# Patient Record
Sex: Female | Born: 1970 | Race: Black or African American | Hispanic: No | Marital: Single | Smoking: Never smoker
Health system: Southern US, Community
[De-identification: ages and names within clinical notes are randomized; demographics above are authoritative.]

## PROBLEM LIST (undated history)

## (undated) DIAGNOSIS — R609 Edema, unspecified: Secondary | ICD-10-CM

## (undated) DIAGNOSIS — E079 Disorder of thyroid, unspecified: Secondary | ICD-10-CM

## (undated) DIAGNOSIS — E039 Hypothyroidism, unspecified: Secondary | ICD-10-CM

## (undated) DIAGNOSIS — T7840XA Allergy, unspecified, initial encounter: Secondary | ICD-10-CM

## (undated) DIAGNOSIS — R011 Cardiac murmur, unspecified: Secondary | ICD-10-CM

## (undated) DIAGNOSIS — D649 Anemia, unspecified: Secondary | ICD-10-CM

## (undated) DIAGNOSIS — J45909 Unspecified asthma, uncomplicated: Secondary | ICD-10-CM

## (undated) HISTORY — PX: BREAST SURGERY: SHX581

## (undated) HISTORY — PX: CHOLECYSTECTOMY: SHX55

## (undated) HISTORY — PX: BREAST CYST ASPIRATION: SHX578

## (undated) HISTORY — PX: TUBAL LIGATION: SHX77

---

## 1998-12-23 ENCOUNTER — Other Ambulatory Visit: Admission: RE | Admit: 1998-12-23 | Discharge: 1998-12-23 | Payer: Self-pay | Admitting: Obstetrics and Gynecology

## 1999-07-31 ENCOUNTER — Inpatient Hospital Stay (HOSPITAL_COMMUNITY): Admission: AD | Admit: 1999-07-31 | Discharge: 1999-07-31 | Payer: Self-pay | Admitting: Obstetrics and Gynecology

## 1999-08-02 ENCOUNTER — Inpatient Hospital Stay (HOSPITAL_COMMUNITY): Admission: AD | Admit: 1999-08-02 | Discharge: 1999-08-04 | Payer: Self-pay | Admitting: *Deleted

## 1999-09-02 ENCOUNTER — Other Ambulatory Visit: Admission: RE | Admit: 1999-09-02 | Discharge: 1999-09-02 | Payer: Self-pay | Admitting: Obstetrics and Gynecology

## 2000-10-22 ENCOUNTER — Other Ambulatory Visit: Admission: RE | Admit: 2000-10-22 | Discharge: 2000-10-22 | Payer: Self-pay | Admitting: Obstetrics and Gynecology

## 2000-11-26 ENCOUNTER — Encounter (INDEPENDENT_AMBULATORY_CARE_PROVIDER_SITE_OTHER): Payer: Self-pay | Admitting: Specialist

## 2000-11-26 ENCOUNTER — Ambulatory Visit (HOSPITAL_COMMUNITY): Admission: RE | Admit: 2000-11-26 | Discharge: 2000-11-26 | Payer: Self-pay | Admitting: *Deleted

## 2000-12-14 ENCOUNTER — Emergency Department (HOSPITAL_COMMUNITY): Admission: EM | Admit: 2000-12-14 | Discharge: 2000-12-14 | Payer: Self-pay | Admitting: Emergency Medicine

## 2001-12-09 ENCOUNTER — Ambulatory Visit (HOSPITAL_COMMUNITY): Admission: RE | Admit: 2001-12-09 | Discharge: 2001-12-09 | Payer: Self-pay | Admitting: Gastroenterology

## 2001-12-09 ENCOUNTER — Encounter: Payer: Self-pay | Admitting: Gastroenterology

## 2001-12-21 ENCOUNTER — Other Ambulatory Visit: Admission: RE | Admit: 2001-12-21 | Discharge: 2001-12-21 | Payer: Self-pay | Admitting: Obstetrics and Gynecology

## 2001-12-23 ENCOUNTER — Observation Stay (HOSPITAL_COMMUNITY): Admission: RE | Admit: 2001-12-23 | Discharge: 2001-12-23 | Payer: Self-pay | Admitting: *Deleted

## 2001-12-23 ENCOUNTER — Encounter (INDEPENDENT_AMBULATORY_CARE_PROVIDER_SITE_OTHER): Payer: Self-pay | Admitting: Specialist

## 2003-02-23 ENCOUNTER — Emergency Department (HOSPITAL_COMMUNITY): Admission: EM | Admit: 2003-02-23 | Discharge: 2003-02-23 | Payer: Self-pay

## 2003-07-02 ENCOUNTER — Encounter: Admission: RE | Admit: 2003-07-02 | Discharge: 2003-07-02 | Payer: Self-pay | Admitting: Internal Medicine

## 2005-06-25 ENCOUNTER — Emergency Department (HOSPITAL_COMMUNITY): Admission: EM | Admit: 2005-06-25 | Discharge: 2005-06-25 | Payer: Self-pay | Admitting: Emergency Medicine

## 2007-09-04 ENCOUNTER — Emergency Department (HOSPITAL_COMMUNITY): Admission: EM | Admit: 2007-09-04 | Discharge: 2007-09-04 | Payer: Self-pay | Admitting: Emergency Medicine

## 2009-05-06 ENCOUNTER — Inpatient Hospital Stay (HOSPITAL_COMMUNITY): Admission: AD | Admit: 2009-05-06 | Discharge: 2009-05-06 | Payer: Self-pay | Admitting: Obstetrics and Gynecology

## 2009-05-22 ENCOUNTER — Inpatient Hospital Stay (HOSPITAL_COMMUNITY): Admission: AD | Admit: 2009-05-22 | Discharge: 2009-05-25 | Payer: Self-pay | Admitting: Obstetrics and Gynecology

## 2010-02-24 ENCOUNTER — Ambulatory Visit (HOSPITAL_COMMUNITY)
Admission: RE | Admit: 2010-02-24 | Discharge: 2010-02-24 | Payer: Self-pay | Source: Home / Self Care | Attending: Obstetrics and Gynecology | Admitting: Obstetrics and Gynecology

## 2010-05-19 LAB — CBC
HCT: 35.1 % — ABNORMAL LOW (ref 36.0–46.0)
Hemoglobin: 11.4 g/dL — ABNORMAL LOW (ref 12.0–15.0)
MCH: 27.9 pg (ref 26.0–34.0)
MCHC: 32.5 g/dL (ref 30.0–36.0)
MCV: 86 fL (ref 78.0–100.0)
Platelets: 200 10*3/uL (ref 150–400)
RBC: 4.08 MIL/uL (ref 3.87–5.11)
RDW: 12 % (ref 11.5–15.5)
WBC: 6.7 10*3/uL (ref 4.0–10.5)

## 2010-05-19 LAB — SURGICAL PCR SCREEN
MRSA, PCR: NEGATIVE
Staphylococcus aureus: NEGATIVE

## 2010-06-02 LAB — CBC
HCT: 33.2 % — ABNORMAL LOW (ref 36.0–46.0)
HCT: 35 % — ABNORMAL LOW (ref 36.0–46.0)
Hemoglobin: 11.1 g/dL — ABNORMAL LOW (ref 12.0–15.0)
Hemoglobin: 11.6 g/dL — ABNORMAL LOW (ref 12.0–15.0)
MCHC: 33.2 g/dL (ref 30.0–36.0)
MCHC: 33.4 g/dL (ref 30.0–36.0)
MCV: 93.1 fL (ref 78.0–100.0)
MCV: 94.8 fL (ref 78.0–100.0)
Platelets: 159 10*3/uL (ref 150–400)
Platelets: 189 10*3/uL (ref 150–400)
RBC: 3.5 MIL/uL — ABNORMAL LOW (ref 3.87–5.11)
RBC: 3.76 MIL/uL — ABNORMAL LOW (ref 3.87–5.11)
RDW: 13 % (ref 11.5–15.5)
RDW: 13.3 % (ref 11.5–15.5)
WBC: 12.6 10*3/uL — ABNORMAL HIGH (ref 4.0–10.5)
WBC: 9.1 10*3/uL (ref 4.0–10.5)

## 2010-06-02 LAB — RPR: RPR Ser Ql: NONREACTIVE

## 2010-07-25 NOTE — Discharge Summary (Signed)
[facility]  Patient:    Theresa Mann, Theresa Mann                    MRN: [id number] Adm. Date:  [id number] Disc. Date: [id number] Attending:  Ardeen Fillers                           Discharge Summary  DISCHARGE DIAGNOSES:          1. Intrauterine pregnancy at term, delivered.                               2. Rh negative. DD:  08/18/99 TD:  08/19/99 Job: [id number]

## 2010-07-25 NOTE — Discharge Summary (Signed)
[facility]  Patient:    Theresa Mann, Theresa Mann                    MRN: [id number] Adm. Date:  19147829 Disc. Date: [id number] Attending:  Ardeen Fillers CC:         Sung Amabile. Roslyn Smiling, M.D. at office                           Discharge Summary  DISCHARGE DIAGNOSES:          1. Intrauterine pregnancy at term.                               2. Rh negative (baby Rh negative).                               3. Maternal obesity.                               4. History of herpes simplex virus.                               5. Hypothyroidism.                               6. Postpartum endometritis.  OPERATIVE PROCEDURE:          Spontaneous vaginal delivery and Pitocin August on Aug 02, 1999.  HISTORY OF PRESENT ILLNESS:   A 40 year old black female, G2 P0-0-1-0, [facility] Jul 29, 1999 by dates and mid second trimester ultrasound, with complaints of spontaneous rupture of membranes for clear fluid early in the morning of May 26.  Intermittent uterine contractions ensued regularly.                                Antenatal course remarkable for:  1. Hyperemesis gravidarum with weight loss in the first trimester.  2. Symptoms of herpes simplex virus, July 04, 1999, none since.  Valtrex prophylaxis ongoing.  HOSPITAL COURSE:              The patient was admitted to [facility].  A suboptimal labor contraction pattern was noted.  Pitocin augmentation was initiated when the cervix was 6 to 7 cm.  Labor progressed well thereafter. She received IV sedation.  She was delivered spontaneously of a live female, 3555 g with Apgars of 8 and 9, over an intact perineum after a one hour and four minute second stage.  Estimated blood loss was 450 cc.  Initial uterine atony was controlled with uterine massage, IV Pitocin, and intramuscular Methergine.                                Postpartum, her temperature went to 101.  Unasyn was initiated for presumed postpartum  endometritis.  White blood count on postpartum day #1 was 20.7.  She defervesced and medication was stopped after she was afebrile for more than 24 hours.  At the time of discharge, on postpartum day #2, her hemoglobin was 11.2, and she was afebrile  and nontender.  FOLLOW-UP:                    [facility] in four to six weeks.  INSTRUCTIONS:                 Routine status post vaginal delivery instructions were given.  MEDICATIONS AT DISCHARGE:     Prenatal vitamins and nonsteroidal anti-inflammatory medications. DD:  09/05/99 TD:  09/06/99 Job: [id number]

## 2010-07-25 NOTE — Op Note (Signed)
   NAME:  Theresa Mann, Theresa Mann                       ACCOUNT NO.:  [id number]   MEDICAL RECORD NO.:  [id number]                   PATIENT TYPE:  OBV   LOCATION:  0440                                 FACILITY:  [facility]   PHYSICIAN:  Vikki Ports, M.D.         DATE OF BIRTH:  1971/02/04   DATE OF PROCEDURE:  DATE OF DISCHARGE:  12/23/2001                                 OPERATIVE REPORT   PREOPERATIVE DIAGNOSIS:  Biliary dyskinesia.   POSTOPERATIVE DIAGNOSIS:  Biliary dyskinesia.   PROCEDURE:  Laparoscopic cholecystectomy.   SURGEON:  Vikki Ports, M.D.   ASSISTANT:  None.   DESCRIPTION OF PROCEDURE:  The patient was taken to the operating room and  placed in Mann supine position.  After adequate anesthesia was induced using  endotracheal tube, the abdomen was prepped and draped in the normal sterile  fashion.  Using Mann transverse infraumbilical incision, I dissected down to  the fascia.  Fascia was opened vertically.  An 0 Vicryl pursestring suture  was placed around the fascial defect and Hasson trocar was placed in the  abdomen.  Abdomen was insufflated with carbon dioxide.  Under direct  visualization, Mann 10 mm port was placed in the subxiphoid region, and two 5  mm ports were placed in the right abdomen.  Gallbladder was identified and  retracted cephalad.  There were no adhesions to the gallbladder and Mann very  thin mesentery; Mann very long cystic duct was identified , triply clipped, and  divided.  Cystic artery also was quite long, was easily visualized, triply  clipped and divided.  Gallbladder was taken off the gallbladder bed using  electrocautery and removed from the umbilicus. port.  Adequate hemostasis  was noted and assured.  Pneumoperitoneum was released.  Infraumbilical  fascial defect was closed with the 0 Vicryl pursestring suture.  Skin  incisions were closed with subcuticular 4-0 Monocryl.  Steri-Strips and  sterile dressings were applied.  The patient  tolerated the procedure well  and went to PACU in good condition.  Infraumbilical defect was closed with  the 0 Vicryl pursestring suture.  Skin incisions were closed with  subcuticular 4-0 Monocryl.  All incisions were injected using 0.5% Marcaine.  Steri-Strips and sterile dressings were applied.  The patient tolerated the  procedure well and went to PACU in good condition.                                               Vikki Ports, M.D.    KRH/MEDQ  D:  12/23/2001  T:  12/24/2001  Job:  [id number]

## 2010-07-25 NOTE — Op Note (Signed)
[facility]  Patient:    Theresa Mann, Theresa Mann Visit Number: [id number] MRN: [id number]          Service Type: DSU Location: DAY Attending Physician:  Vikki Ports. Dictated by:   Vikki Ports, M.D. Proc. Date: 11/26/00 Admit Date:  11/26/2000                             Operative Report  PREOPERATIVE DIAGNOSIS:  Left breast sebaceous cyst.  POSTOPERATIVE DIAGNOSIS:  Left breast sebaceous cyst.  PROCEDURE:  Excision of left breast sebaceous cyst.  SURGEON:  Vikki Ports, M.D.  ANESTHESIA:  Local MAC.  DESCRIPTION OF PROCEDURE:  The patient was taken to the operating room and placed in the supine position.  After adequate anesthesia was induced using MAC technique, the left breast was prepped and draped in the normal sterile fashion.  The areolar skin and retroareolar tissue was anesthetized in the 12 oclock region of the left breast.  An elliptical incision was made over the palpable mass.  The mass was excised in its entirety down to normal healthy breast tissue.  Adequate hemostasis was ensured.  The skin was closed with subcuticular 4-0 Monocryl.  Dermabond dressing was placed.  The patient tolerated the procedure well and went to PACU in good condition. Dictated by:   Vikki Ports, M.D. Attending Physician:  Danna Hefty R. DD:  11/26/00 TD:  11/26/00 Job: [id number]

## 2010-07-31 ENCOUNTER — Observation Stay (HOSPITAL_COMMUNITY)
Admission: EM | Admit: 2010-07-31 | Discharge: 2010-08-01 | Disposition: A | Discharge status: Home / Self Care | Payer: Managed Care, Other (non HMO) | Attending: Emergency Medicine | Admitting: Emergency Medicine

## 2010-07-31 DIAGNOSIS — L0201 Cutaneous abscess of face: Secondary | ICD-10-CM | POA: Insufficient documentation

## 2010-07-31 DIAGNOSIS — L03211 Cellulitis of face: Secondary | ICD-10-CM | POA: Insufficient documentation

## 2010-07-31 DIAGNOSIS — L02818 Cutaneous abscess of other sites: Principal | ICD-10-CM | POA: Insufficient documentation

## 2010-07-31 DIAGNOSIS — L03818 Cellulitis of other sites: Secondary | ICD-10-CM | POA: Insufficient documentation

## 2010-07-31 LAB — BASIC METABOLIC PANEL
BUN: 8 mg/dL (ref 6–23)
CO2: 26 mEq/L (ref 19–32)
Calcium: 9.6 mg/dL (ref 8.4–10.5)
Chloride: 103 mEq/L (ref 96–112)
Creatinine, Ser: 0.67 mg/dL (ref 0.4–1.2)
GFR calc Af Amer: >60 mL/min (ref >60)
GFR calc non Af Amer: >60 mL/min (ref >60)
Glucose, Bld: 76 mg/dL (ref 70–99)
Potassium: 3.4 mEq/L — ABNORMAL LOW (ref 3.5–5.1)
Sodium: 138 mEq/L (ref 135–145)

## 2010-07-31 LAB — DIFFERENTIAL
Basophils Absolute: 0 10*3/uL (ref 0.0–0.1)
Basophils Relative: 1 % (ref 0–1)
Eosinophils Absolute: 0.2 10*3/uL (ref 0.0–0.7)
Eosinophils Relative: 3 % (ref 0–5)
Lymphocytes Relative: 39 % (ref 12–46)
Lymphs Abs: 2.8 10*3/uL (ref 0.7–4.0)
Monocytes Absolute: 0.3 10*3/uL (ref 0.1–1.0)
Monocytes Relative: 4 % (ref 3–12)
Neutro Abs: 3.9 10*3/uL (ref 1.7–7.7)
Neutrophils Relative %: 54 % (ref 43–77)

## 2010-07-31 LAB — CBC
HCT: 36.1 % (ref 36.0–46.0)
Hemoglobin: 12.1 g/dL (ref 12.0–15.0)
MCH: 28.4 pg (ref 26.0–34.0)
MCHC: 33.5 g/dL (ref 30.0–36.0)
MCV: 84.7 fL (ref 78.0–100.0)
Platelets: 231 10*3/uL (ref 150–400)
RBC: 4.26 MIL/uL (ref 3.87–5.11)
RDW: 13.7 % (ref 11.5–15.5)
WBC: 7.2 10*3/uL (ref 4.0–10.5)

## 2010-12-04 LAB — POCT I-STAT, CHEM 8
BUN: 11
Calcium, Ion: 1.08 — ABNORMAL LOW
Chloride: 103
Creatinine, Ser: 1.1
Glucose, Bld: 99
HCT: 40
Hemoglobin: 13.6
Potassium: 3.2 — ABNORMAL LOW
Sodium: 136
TCO2: 26

## 2010-12-04 LAB — URINALYSIS, ROUTINE W REFLEX MICROSCOPIC
Bilirubin Urine: NEGATIVE
Glucose, UA: NEGATIVE
Hgb urine dipstick: NEGATIVE
Ketones, ur: NEGATIVE
Nitrite: NEGATIVE
Protein, ur: NEGATIVE
Specific Gravity, Urine: 1.02
Urobilinogen, UA: 0.2
pH: 7.5

## 2010-12-04 LAB — POCT PREGNANCY, URINE
Operator id: 294341
Preg Test, Ur: NEGATIVE

## 2011-03-28 ENCOUNTER — Encounter (HOSPITAL_COMMUNITY): Payer: Self-pay

## 2011-03-28 ENCOUNTER — Emergency Department (HOSPITAL_COMMUNITY)
Admission: EM | Admit: 2011-03-28 | Discharge: 2011-03-29 | Disposition: A | Discharge status: Home / Self Care | Payer: Managed Care, Other (non HMO) | Attending: Emergency Medicine | Admitting: Emergency Medicine

## 2011-03-28 DIAGNOSIS — R0602 Shortness of breath: Secondary | ICD-10-CM | POA: Insufficient documentation

## 2011-03-28 DIAGNOSIS — R109 Unspecified abdominal pain: Secondary | ICD-10-CM | POA: Insufficient documentation

## 2011-03-28 DIAGNOSIS — R197 Diarrhea, unspecified: Secondary | ICD-10-CM | POA: Insufficient documentation

## 2011-03-28 DIAGNOSIS — T7840XA Allergy, unspecified, initial encounter: Secondary | ICD-10-CM

## 2011-03-28 DIAGNOSIS — R112 Nausea with vomiting, unspecified: Secondary | ICD-10-CM | POA: Insufficient documentation

## 2011-03-28 DIAGNOSIS — E079 Disorder of thyroid, unspecified: Secondary | ICD-10-CM | POA: Insufficient documentation

## 2011-03-28 HISTORY — DX: Disorder of thyroid, unspecified: E07.9

## 2011-03-28 HISTORY — DX: Allergy, unspecified, initial encounter: T78.40XA

## 2011-03-28 NOTE — ED Notes (Signed)
Allergic reaction- unknown cause- Pt is thick tongued- denies trouble  swallowing

## 2011-03-29 MED ORDER — PREDNISONE 20 MG PO TABS
60.0000 mg | ORAL_TABLET | Freq: Once | ORAL | Status: AC
  Administered 2011-03-29: 60 mg via ORAL
  Filled 2011-03-29: qty 3

## 2011-03-29 MED ORDER — PREDNISONE 20 MG PO TABS: 60.0000 mg | ORAL_TABLET | Freq: Every day | ORAL | Status: AC

## 2011-03-29 NOTE — ED Notes (Signed)
Pt with numerous reported allergies, unclear of source of tonights reaction; took liquid benadryl but vomited.

## 2011-03-29 NOTE — ED Notes (Signed)
MD at bedside. 

## 2011-03-29 NOTE — ED Provider Notes (Signed)
History     CSN: [id number]  Arrival date & time 03/28/11  2310   First MD Initiated Contact with Patient 03/29/11 0024      Chief Complaint  Patient presents with  . Allergic Reaction    (Consider location/radiation/quality/duration/timing/severity/associated sxs/prior treatment) Patient is a 41 y.o. female presenting with allergic reaction. The history is provided by the patient.  Allergic Reaction The primary symptoms do not include shortness of breath, cough, abdominal pain, nausea, vomiting, diarrhea, palpitations or rash.   the patient is a 41 year old, female, who woke up around 10:00 this evening feeling as if her tongue was swelling and she had numbness in her mouth.  She took Benadryl because she thought she was having an allergic reaction.  She denied cough, difficulty breathing, lightheadedness, or palpitations.  She feels like her mouth is still numb, but that swelling.  Has gone down.  She denies a sensation of lump in her throat.  She did not eat anything unusual or asthma.  Taken any new medications.  She has not had a similar reaction in the past.  Past Medical History  Diagnosis Date  . Allergic   . Thyroid disease     Past Surgical History  Procedure Date  . Cholecystectomy     No family history on file.  History  Substance Use Topics  . Smoking status: Never Smoker   . Smokeless tobacco: Not on file  . Alcohol Use: Yes    OB History    Grav Para Term Preterm Abortions TAB SAB Ect Mult Living                  Review of Systems  HENT: Negative for trouble swallowing and voice change.        Sensation of tongue swelling, and oral, numbness  Respiratory: Negative for cough and shortness of breath.   Cardiovascular: Negative for chest pain and palpitations.  Gastrointestinal: Negative for nausea, vomiting, abdominal pain and diarrhea.  Skin: Negative for rash.  Neurological: Positive for numbness. Negative for light-headedness.  All other systems  reviewed and are negative.    Allergies  Penicillins  Home Medications   Current Outpatient Rx  Name Route Sig Dispense Refill  . EPINEPHRINE 0.15 MG/0.3ML IJ DEVI Intramuscular Inject 0.15 mg into the muscle as needed.    Marland Kitchen LEVOTHYROXINE SODIUM 300 MCG PO TABS Oral Take 300 mcg by mouth daily.      BP 123/85  Pulse 72  Temp(Src) 98.5 F (36.9 C) (Oral)  Resp 18  Wt 215 lb (97.523 kg)  SpO2 100%  LMP 03/08/2011  Physical Exam  Vitals reviewed. Constitutional: She is oriented to person, place, and time. She appears well-developed and well-nourished.  HENT:  Head: Normocephalic and atraumatic.  Mouth/Throat: Oropharynx is clear and moist. No oropharyngeal exudate.  Eyes: Pupils are equal, round, and reactive to light.  Neck: Normal range of motion.  Cardiovascular: Normal rate, regular rhythm and normal heart sounds.   No murmur heard. Pulmonary/Chest: Effort normal and breath sounds normal. No respiratory distress. She has no wheezes. She has no rales.  Abdominal: Soft. She exhibits no distension and no mass. There is no tenderness. There is no rebound and no guarding.  Musculoskeletal: Normal range of motion. She exhibits no edema and no tenderness.  Neurological: She is alert and oriented to person, place, and time. No cranial nerve deficit.  Skin: Skin is warm and dry. No rash noted. No erythema.  Psychiatric: She has a normal mood  and affect. Her behavior is normal.    ED Course  Procedures (including critical care time) 41 year old, female, who has had the sensation of tongue swelling, and oral, numbness, presents to the emergency department for evaluation of a possible allergic reaction.  She has persistent numbness in her mouth tongue swelling.  No wheezing.  No respiratory distress.  No signs of an acute allergic reaction.  At this time.  We'll give oral prednisone and monitor for a few hours.  Labs Reviewed - No data to display No results found.   No  diagnosis found.  2:42 AM Says she feels "much better."  pe nl  MDM  Allergic rxn. Resolved No signs anaphylaxis        Nicholes Stairs, MD 03/29/11 [id number]

## 2011-12-24 ENCOUNTER — Observation Stay (HOSPITAL_COMMUNITY)
Admission: EM | Admit: 2011-12-24 | Discharge: 2011-12-25 | Disposition: A | Discharge status: Home / Self Care | Payer: BC Managed Care – PPO | Attending: Emergency Medicine | Admitting: Emergency Medicine

## 2011-12-24 ENCOUNTER — Encounter (HOSPITAL_COMMUNITY): Payer: Self-pay | Admitting: Family Medicine

## 2011-12-24 DIAGNOSIS — L03119 Cellulitis of unspecified part of limb: Secondary | ICD-10-CM | POA: Insufficient documentation

## 2011-12-24 DIAGNOSIS — L0390 Cellulitis, unspecified: Secondary | ICD-10-CM

## 2011-12-24 DIAGNOSIS — L02619 Cutaneous abscess of unspecified foot: Principal | ICD-10-CM | POA: Insufficient documentation

## 2011-12-24 LAB — CBC WITH DIFFERENTIAL/PLATELET
Basophils Absolute: 0 10*3/uL (ref 0.0–0.1)
Basophils Relative: 0 % (ref 0–1)
Eosinophils Absolute: 0.1 10*3/uL (ref 0.0–0.7)
Eosinophils Relative: 2 % (ref 0–5)
HCT: 34.7 % — ABNORMAL LOW (ref 36.0–46.0)
Hemoglobin: 11.3 g/dL — ABNORMAL LOW (ref 12.0–15.0)
Lymphocytes Relative: 30 % (ref 12–46)
Lymphs Abs: 1.9 10*3/uL (ref 0.7–4.0)
MCH: 25.6 pg — ABNORMAL LOW (ref 26.0–34.0)
MCHC: 32.6 g/dL (ref 30.0–36.0)
MCV: 78.5 fL (ref 78.0–100.0)
Monocytes Absolute: 0.4 10*3/uL (ref 0.1–1.0)
Monocytes Relative: 6 % (ref 3–12)
Neutro Abs: 4 10*3/uL (ref 1.7–7.7)
Neutrophils Relative %: 62 % (ref 43–77)
Platelets: 244 10*3/uL (ref 150–400)
RBC: 4.42 MIL/uL (ref 3.87–5.11)
RDW: 14.6 % (ref 11.5–15.5)
WBC: 6.4 10*3/uL (ref 4.0–10.5)

## 2011-12-24 LAB — URINALYSIS, ROUTINE W REFLEX MICROSCOPIC
Bilirubin Urine: NEGATIVE
Glucose, UA: NEGATIVE mg/dL
Hgb urine dipstick: NEGATIVE
Ketones, ur: 15 mg/dL — AB
Leukocytes, UA: NEGATIVE
Nitrite: NEGATIVE
Protein, ur: NEGATIVE mg/dL
Specific Gravity, Urine: 1.025 (ref 1.005–1.030)
Urobilinogen, UA: 0.2 mg/dL (ref 0.0–1.0)
pH: 6 (ref 5.0–8.0)

## 2011-12-24 LAB — BASIC METABOLIC PANEL
BUN: 7 mg/dL (ref 6–23)
CO2: 25 mEq/L (ref 19–32)
Calcium: 9.3 mg/dL (ref 8.4–10.5)
Chloride: 102 mEq/L (ref 96–112)
Creatinine, Ser: 0.77 mg/dL (ref 0.50–1.10)
GFR calc Af Amer: >90 mL/min (ref >90)
GFR calc non Af Amer: >90 mL/min (ref >90)
Glucose, Bld: 83 mg/dL (ref 70–99)
Potassium: 3.3 mEq/L — ABNORMAL LOW (ref 3.5–5.1)
Sodium: 137 mEq/L (ref 135–145)

## 2011-12-24 MED ORDER — CLINDAMYCIN PHOSPHATE 600 MG/50ML IV SOLN
600.0000 mg | Freq: Three times a day (TID) | INTRAVENOUS | Status: DC
  Administered 2011-12-24 – 2011-12-25 (×3): 600 mg via INTRAVENOUS
  Filled 2011-12-24 (×3): qty 50

## 2011-12-24 MED ORDER — ONDANSETRON HCL 4 MG/2ML IJ SOLN: 4.0000 mg | Freq: Four times a day (QID) | INTRAMUSCULAR | Status: DC | PRN

## 2011-12-24 MED ORDER — HYDROMORPHONE HCL PF 1 MG/ML IJ SOLN
1.0000 mg | INTRAMUSCULAR | Status: DC | PRN
  Administered 2011-12-24 – 2011-12-25 (×3): 1 mg via INTRAVENOUS
  Filled 2011-12-24 (×3): qty 1

## 2011-12-24 MED ORDER — SODIUM CHLORIDE 0.9 % IV SOLN
1000.0000 mL | INTRAVENOUS | Status: DC
  Administered 2011-12-24 (×3): 1000 mL via INTRAVENOUS

## 2011-12-24 MED ORDER — SODIUM CHLORIDE 0.9 % IV SOLN
1000.0000 mL | Freq: Once | INTRAVENOUS | Status: AC
  Administered 2011-12-24: 1000 mL via INTRAVENOUS

## 2011-12-24 MED ORDER — ONDANSETRON HCL 4 MG/2ML IJ SOLN
4.0000 mg | INTRAMUSCULAR | Status: DC | PRN
  Administered 2011-12-24 (×3): 4 mg via INTRAVENOUS
  Filled 2011-12-24 (×4): qty 2

## 2011-12-24 NOTE — ED Provider Notes (Signed)
History     CSN: [id number]  Arrival date & time 12/24/11  1120   First MD Initiated Contact with Patient 12/24/11 1136      Chief Complaint  Patient presents with  . Cellulitis    (Consider location/radiation/quality/duration/timing/severity/associated sxs/prior treatment) HPI Comments: Theresa Mann 41 y.o. female   The chief complaint is: Patient presents with:   Cellulitis    41 y/o female presents with cc cellulitis of left foot.  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  Patient [facility] for [facility]. Patient states that onset of symptoms was yesterday with should, left foot swelling. By the end of the day. Her foot with red, hot, and swollen. The middle of the night. She noticed a blister had developed between her second and third digit on the dorsal surface of the foot. She has a history of intermittent hot, red, swollen areas of her feet. She states that they normally go away on their own. She has never had cellulitis of the foot. She denies any mechanism of injury to the foot. She denies history of gout. She denies history inflammatory arthritis. She denies chills, fever, nausea, vomiting. Denies, chest pain, shortness of breath. Denies abdominal pain, or diarrhea. Denies, headache, lightheadedness, visual disturbances, difficulty swallowing.      Past Medical History  Diagnosis Date  . Allergic   . Thyroid disease     Past Surgical History  Procedure Date  . Cholecystectomy     History reviewed. No pertinent family history.  History  Substance Use Topics  . Smoking status: Never Smoker   . Smokeless tobacco: Not on file  . Alcohol Use: Yes    OB History    Grav Para Term Preterm Abortions TAB SAB Ect Mult Living                  Review of Systems  Constitutional: Negative for fever and chills.  HENT: Negative for trouble swallowing.   Eyes: Negative for visual disturbance.  Respiratory:  Negative for shortness of breath.   Cardiovascular: Negative for chest pain.  Gastrointestinal: Negative for nausea, vomiting, abdominal pain, diarrhea and constipation.  Genitourinary: Negative for dysuria and hematuria.  Musculoskeletal: Negative for myalgias and arthralgias.  Skin: Negative for rash.  Neurological: Negative for numbness and headaches.  Psychiatric/Behavioral: Negative for behavioral problems.  All other systems reviewed and are negative.    Allergies  Penicillins  Home Medications   Current Outpatient Rx  Name Route Sig Dispense Refill  . ACETAMINOPHEN 325 MG PO TABS Oral Take 650 mg by mouth every 6 (six) hours as needed. For pain    . CETIRIZINE HCL 10 MG PO TABS Oral Take 10 mg by mouth daily as needed. For allergies    . EPINEPHRINE 0.3 MG/0.3ML IJ DEVI Intramuscular Inject 0.3 mg into the muscle once.    . IBUPROFEN 200 MG PO TABS Oral Take 400 mg by mouth every 6 (six) hours as needed. For pain    . LEVOTHYROXINE SODIUM 125 MCG PO TABS Oral Take 250 mcg by mouth daily.      BP 142/86  Pulse 63  Temp 98.7 F (37.1 C) (Oral)  Resp 20  SpO2 100%  LMP 12/10/2011  Physical Exam  Constitutional: She is oriented to person, place, and time. She appears well-developed and well-nourished. No distress.  HENT:  Head: Normocephalic and atraumatic.  Eyes: Conjunctivae normal are normal. No scleral icterus.  Neck: Normal range of motion.  Cardiovascular: Normal rate, regular rhythm and normal heart sounds.  Exam reveals no gallop and no friction rub.   No murmur heard. Pulmonary/Chest: Effort normal and breath sounds normal. No respiratory distress.  Abdominal: Soft. Bowel sounds are normal. She exhibits no distension and no mass. There is no tenderness. There is no guarding.  Musculoskeletal:       Left foot: She exhibits tenderness and swelling. She exhibits normal capillary refill and no laceration.       Feet:       Left foot, hot, red and swollen.  Patient is tender to palpation of the dorsal surface in the ball of the foot. There is a large bulla on the dorsal surface of the foot between toes 2 and 3. No wounds. No ecchymosis, or signs of, no discharge from the foot. She is neurovascularly intact. Very tender to palpation. Range of motion is decreased due to pain. Right foot with 3cm x 3cm  Area of right foot inferior to the medial malleolus. ttp but without drainage   Neurological: She is alert and oriented to person, place, and time.  Skin: Skin is warm and dry. She is not diaphoretic.    ED Course  Procedures (including critical care time)  Results for orders placed during the hospital encounter of 07/31/10  DIFFERENTIAL      Component Value Range   Neutrophils Relative 54  43 - 77 %   Neutro Abs 3.9  1.7 - 7.7 K/uL   Lymphocytes Relative 39  12 - 46 %   Lymphs Abs 2.8  0.7 - 4.0 K/uL   Monocytes Relative 4  3 - 12 %   Monocytes Absolute 0.3  0.1 - 1.0 K/uL   Eosinophils Relative 3  0 - 5 %   Eosinophils Absolute 0.2  0.0 - 0.7 K/uL   Basophils Relative 1  0 - 1 %   Basophils Absolute 0.0  0.0 - 0.1 K/uL  CBC      Component Value Range   WBC 7.2  4.0 - 10.5 K/uL   RBC 4.26  3.87 - 5.11 MIL/uL   Hemoglobin 12.1  12.0 - 15.0 g/dL   HCT 16.1  09.6 - 04.5 %   MCV 84.7  78.0 - 100.0 fL   MCH 28.4  26.0 - 34.0 pg   MCHC 33.5  30.0 - 36.0 g/dL   RDW 40.9  81.1 - 91.4 %   Platelets 231  150 - 400 K/uL  BASIC METABOLIC PANEL      Component Value Range   Sodium 138  135 - 145 mEq/L   Potassium 3.4 (*) 3.5 - 5.1 mEq/L   Chloride 103  96 - 112 mEq/L   CO2 26  19 - 32 mEq/L   Glucose, Bld 76  70 - 99 mg/dL   BUN 8  6 - 23 mg/dL   Creatinine, Ser 7.82  0.4 - 1.2 mg/dL   Calcium 9.6  8.4 - 95.6 mg/dL   GFR calc non Af Amer >60  >60 mL/min   GFR calc Af Amer    >60 mL/min   Value: >60            The eGFR has been calculated     using the MDRD equation.     This calculation has not been     validated in all clinical      situations.     eGFR's persistently     <  60 mL/min signify     possible Chronic Kidney Disease.     No results found.   1. Cellulitis       MDM  Patient with normal lab values.  I will begin patient on IV clindamycin.  Patient will be moved to CDU on cellulititis protocol. I have spoken with Trixie Dredge PA-c who will assume care of the patient in CDU.  Plan:  2 rounds of ABX - iv clinda 6 hours q8h Discharge home if resolution. Patient has script for PO Abx form op provider.  She may go home with pain meds.  Otherwise admit for worsening symptoms        Arthor Captain, PA-C 12/26/11 1517

## 2011-12-24 NOTE — ED Provider Notes (Addendum)
Patient to come to CDU under observation, cellulitis protocol.  Pt with right foot cellulitis.  To be placed on cellulitis protocol - to be given clindamycin here.  Patient has prescriptions for PO clindamycin which she should fill when she is d/c, given clinical improvement.    I have discussed this with Emilia Beck, PA-C, who assumes care of patient upon arrival to CDU.    Trixie Dredge, [place] 12/24/11 1535  12/25/11 7:32 AM Assumed care of patient this morning in CDU.  Pt is on cellulitis protocol.  This is a shared visit with Dr Karma Ganja.  Patient reports great improvement this morning, decreased redness, swelling, and pain.  States she continues to have pain when she attempts to bear weight.  Pt notes this is her third dose of clindamycin.  There is no line drawn around her cellulitis.  However, given patient's report of improvement and comparison to the text of patient's initial assessment, appears to be improved.  Left foot with mild erythema, warmth, tenderness.  Pulses intact.  Pt able to move toes and foot well.  Plan is for discharge.  Pt has prescription for clindamycin.  Will add prescription for pain medication.  Pt to see PCP or return here in two days for recheck.  Will give return precautions.  Pt verbalizes understanding and agrees with plan.    Candy Kitchen, [place] 12/25/11 1144

## 2011-12-24 NOTE — ED Provider Notes (Signed)
Medical screening examination/treatment/procedure(s) were conducted as a shared visit with non-physician practitioner(s) and myself.  I personally evaluated the patient during the encounter  [name], MD 12/24/11 1537

## 2011-12-24 NOTE — Progress Notes (Signed)
Utilization review completed.  

## 2011-12-24 NOTE — ED Notes (Signed)
Pt states yesterday she could "feel left foot swelling." Denies any injury. Blister noted to top of foot, pt states "it just popped up overnight." No other skin issues. Left foot is swollen, red, warm to touch. Pt states she has had this happen before in her face. Md sent pt for IV abx.

## 2011-12-24 NOTE — ED Notes (Signed)
Per pt sts swellnig in left foot since yesterday. Sent here by PCP for IV abx due to cellulitis. Pt foot red, swollen and blistering.

## 2011-12-24 NOTE — ED Provider Notes (Signed)
3:40 PM Patient was placed in CDU on cellulitis protocol by Dr. Karma Ganja. Patient care resumed from [facility] .  Patient is here for cellulitis of left foot and has received IV Clindamycin. While in obeservation over night the pt slept well and had no complaints, per nursing staff. Patient re-evaluated and is resting comfortable, VSS, with no new complaints or concerns at this time. Plan per previous provider is to observe and give another dose of IV Clindamycin in 12 hours before discharge. On exam: hemodynamically stable, NAD, heart w/ RRR, lungs CTAB, Chest & abd non-tender, no peripheral edema or calf tenderness. Generalized left foot swelling and tenderness to palpation without erythema.    9:35 PM Patient doing well. Left foot exam reveals mild improvement of edema. Patient denies current pain.   11:58 PM Patient will be discharged in the morning. No current pain. Patient signed out to Dr. Lavella Lemons.   Emilia Beck, PA-C 12/24/11 2359

## 2011-12-24 NOTE — ED Notes (Signed)
Pt notified urine sample is needed. 

## 2011-12-24 NOTE — ED Notes (Signed)
Pt A.O. X 4. Respirations even and regular. Laying down in bed watching tv. Denies pain. Reports nausea 6/10. Skin warm and dry. No further needs at this time.

## 2011-12-25 ENCOUNTER — Encounter (HOSPITAL_COMMUNITY): Payer: Self-pay | Admitting: Emergency Medicine

## 2011-12-25 ENCOUNTER — Emergency Department (HOSPITAL_COMMUNITY)
Admission: EM | Admit: 2011-12-25 | Discharge: 2011-12-25 | Disposition: A | Discharge status: Home / Self Care | Payer: BC Managed Care – PPO | Attending: Emergency Medicine | Admitting: Emergency Medicine

## 2011-12-25 DIAGNOSIS — T783XXA Angioneurotic edema, initial encounter: Secondary | ICD-10-CM

## 2011-12-25 DIAGNOSIS — T368X5A Adverse effect of other systemic antibiotics, initial encounter: Secondary | ICD-10-CM | POA: Insufficient documentation

## 2011-12-25 DIAGNOSIS — Z888 Allergy status to other drugs, medicaments and biological substances status: Secondary | ICD-10-CM | POA: Insufficient documentation

## 2011-12-25 DIAGNOSIS — T7840XA Allergy, unspecified, initial encounter: Secondary | ICD-10-CM

## 2011-12-25 MED ORDER — FAMOTIDINE IN NACL 20-0.9 MG/50ML-% IV SOLN
20.0000 mg | Freq: Once | INTRAVENOUS | Status: AC
  Administered 2011-12-25: 20 mg via INTRAVENOUS
  Filled 2011-12-25: qty 50

## 2011-12-25 MED ORDER — PREDNISONE 20 MG PO TABS: ORAL_TABLET | ORAL | Status: DC

## 2011-12-25 MED ORDER — HYDROCODONE-ACETAMINOPHEN 5-325 MG PO TABS: 1.0000 | ORAL_TABLET | ORAL | Status: DC | PRN

## 2011-12-25 MED ORDER — EPINEPHRINE HCL 0.1 MG/ML IJ SOLN
0.5000 mg | Freq: Once | INTRAMUSCULAR | Status: AC
  Administered 2011-12-25: 0.5 mg via INTRAMUSCULAR
  Filled 2011-12-25: qty 10

## 2011-12-25 MED ORDER — EPINEPHRINE HCL 0.1 MG/ML IJ SOLN: 0.5000 mg | Freq: Once | INTRAMUSCULAR | Status: DC

## 2011-12-25 MED ORDER — EPINEPHRINE 0.3 MG/0.3ML IJ DEVI: 0.3000 mg | INTRAMUSCULAR | Status: DC | PRN

## 2011-12-25 MED ORDER — EPINEPHRINE HCL 1 MG/ML IJ SOLN
0.5000 mg | Freq: Once | INTRAMUSCULAR | Status: AC
  Administered 2011-12-25: 0.5 mg via INTRAMUSCULAR
  Filled 2011-12-25: qty 1

## 2011-12-25 MED ORDER — SULFAMETHOXAZOLE-TRIMETHOPRIM 800-160 MG PO TABS: 1.0000 | ORAL_TABLET | Freq: Two times a day (BID) | ORAL | Status: DC

## 2011-12-25 MED ORDER — DIPHENHYDRAMINE HCL 50 MG/ML IJ SOLN
50.0000 mg | Freq: Once | INTRAMUSCULAR | Status: AC
  Administered 2011-12-25: 50 mg via INTRAVENOUS
  Filled 2011-12-25: qty 1

## 2011-12-25 MED ORDER — METHYLPREDNISOLONE SODIUM SUCC 125 MG IJ SOLR
125.0000 mg | Freq: Once | INTRAMUSCULAR | Status: AC
  Administered 2011-12-25: 125 mg via INTRAVENOUS
  Filled 2011-12-25: qty 2

## 2011-12-25 MED ORDER — EPINEPHRINE HCL 1 MG/ML IJ SOLN
INTRAMUSCULAR | Status: AC
  Filled 2011-12-25: qty 1

## 2011-12-25 MED ORDER — FAMOTIDINE 20 MG PO TABS: 20.0000 mg | ORAL_TABLET | Freq: Every day | ORAL | Status: DC

## 2011-12-25 MED ORDER — EPINEPHRINE HCL 0.1 MG/ML IJ SOLN
0.3000 mg | Freq: Once | INTRAMUSCULAR | Status: AC
  Administered 2011-12-25: 0.3 mg via INTRAMUSCULAR
  Filled 2011-12-25: qty 10

## 2011-12-25 NOTE — ED Provider Notes (Signed)
History  This chart was scribed for Ward Givens, MD by Ladona Ridgel Day. This patient was seen in room TR04C/TR04C and the patient's care was started at 1558.  CSN: [id number]  Arrival date & time 12/25/11  1558   None     Chief Complaint  Patient presents with  . Medication Reaction   The history is provided by the patient. No language interpreter was used.   Theresa Mann is a 41 y.o. female who presents to the Emergency Department complaining of allergic reaction to clindamycin about 30 minutes ago.Pt states she was in the [facility] getting IV clindamycin for cellulitis in her left foot and was discharged at 9 am this morning. She states during the night dose she had some itching around her posterior neck and back and noticed swelling of her right foot today. She states she also has swelling of her left hand where she had had her IV. She went to the pharmacy and got her oral clindamycin and within 5 minutes of taking it she started having swelling of her tongue.  She denies wheezing or SOB. She states itching over neck and back. She states difficulty speaking because he tongue is very swollen. She denies any similar previous episodes.   PCP Andi Devon  Past Medical History  Diagnosis Date  . Allergic   . Thyroid disease     Past Surgical History  Procedure Date  . Cholecystectomy     No family history on file.  History  Substance Use Topics  . Smoking status: Never Smoker   . Smokeless tobacco: Not on file  . Alcohol Use: No    OB History    Grav Para Term Preterm Abortions TAB SAB Ect Mult Living                  Review of Systems  Constitutional: Negative for fever and chills.  HENT: Negative for drooling and trouble swallowing.        Tongue swelling  Respiratory: Negative for shortness of breath and wheezing.   Cardiovascular: Negative for chest pain.  Gastrointestinal: Negative for nausea and vomiting.  Neurological: Negative for weakness.  All other systems  reviewed and are negative.    Allergies  Penicillins  Home Medications   Current Outpatient Rx  Name Route Sig Dispense Refill  . CLINDAMYCIN HCL PO Oral Take 1 capsule by mouth 2 (two) times daily. Starting 12/25/11 for 7 days    . EPINEPHRINE 0.3 MG/0.3ML IJ DEVI Intramuscular Inject 0.3 mg into the muscle once.    Marland Kitchen HYDROCODONE-ACETAMINOPHEN 5-325 MG PO TABS Oral Take 1-2 tablets by mouth every 4 (four) hours as needed for pain. 20 tablet 0  . LEVOTHYROXINE SODIUM 125 MCG PO TABS Oral Take 250 mcg by mouth daily.    . ACETAMINOPHEN 325 MG PO TABS Oral Take 650 mg by mouth every 6 (six) hours as needed. For pain    . CETIRIZINE HCL 10 MG PO TABS Oral Take 10 mg by mouth daily as needed. For allergies    . IBUPROFEN 200 MG PO TABS Oral Take 400 mg by mouth every 6 (six) hours as needed. For pain      BP 116/71  Pulse 72  Temp 98.8 F (37.1 C) (Oral)  Resp 16  SpO2 98%  LMP 12/10/2011  Vital signs normal       Physical Exam  Nursing note and vitals reviewed. Constitutional: She is oriented to person, place, and time. She appears well-developed  and well-nourished.  Non-toxic appearance. She does not appear ill. No distress.  HENT:  Head: Normocephalic and atraumatic.  Right Ear: External ear normal.  Left Ear: External ear normal.  Nose: Nose normal. No mucosal edema or rhinorrhea.  Mouth/Throat: Mucous membranes are normal. No dental abscesses or uvula swelling.       Difficulty speaking from swelling of her tongue, tongue is enlarged and slightly protruding from mouth, no drooling  Eyes: Conjunctivae normal and EOM are normal. Pupils are equal, round, and reactive to light.  Neck: Normal range of motion and full passive range of motion without pain. Neck supple.  Cardiovascular: Normal rate, regular rhythm and normal heart sounds.  Exam reveals no gallop and no friction rub.   No murmur heard. Pulmonary/Chest: Effort normal and breath sounds normal. No respiratory  distress. She has no wheezes. She has no rhonchi. She has no rales. She exhibits no tenderness and no crepitus.  Abdominal: Normal appearance.  Musculoskeletal: Normal range of motion. She exhibits no edema and no tenderness.       Moves all extremities well.   Neurological: She is alert and oriented to person, place, and time. She has normal strength. No cranial nerve deficit.       Patient is noted to have diffuse swelling with redness over the MCP piece of the from, left index and middle fingers.  Skin: Skin is warm, dry and intact. No rash noted. No erythema. No pallor.       No urticarial rash seen  Psychiatric: She has a normal mood and affect. Her speech is normal and behavior is normal. Her mood appears not anxious.    ED Course  Procedures (including critical care time)  Medications  CLINDAMYCIN HCL PO (not administered)  sulfamethoxazole-trimethoprim (SEPTRA DS) 800-160 MG per tablet (not administered)  predniSONE (DELTASONE) 20 MG tablet (not administered)  famotidine (PEPCID) 20 MG tablet (not administered)  EPINEPHrine (EPIPEN) 0.3 mg/0.3 mL DEVI (not administered)  EPINEPHrine (ADRENALIN) 0.1 MG/ML injection 0.3 mg (0.3 mg Intramuscular Given 12/25/11 1637)  diphenhydrAMINE (BENADRYL) injection 50 mg (50 mg Intravenous Given 12/25/11 1641)  famotidine (PEPCID) IVPB 20 mg (0 mg Intravenous Stopped 12/25/11 1730)  methylPREDNISolone sodium succinate (SOLU-MEDROL) 125 mg/2 mL injection 125 mg (125 mg Intravenous Given 12/25/11 1642)  EPINEPHrine (ADRENALIN) 0.1 MG/ML injection 0.5 mg (0.5 mg Intramuscular Given 12/25/11 1736)  EPINEPHrine (ADRENALIN) injection 0.5 mg (0.5 mg Intramuscular Given 12/25/11 1927)   DIAGNOSTIC STUDIES: Oxygen Saturation is 100% on room air, normal by my interpretation.    17:10 states she isn't noticing any difference in swelling  Recheck after second higher dose of epi, is able to talk more normally, has mild swelling under her tongue.  Pt  given 3rd dose of epi.   Recheck 20:00 talking normally, viewed her left foot, no swelling, has a clear fluid filled blister at the base of her second toe.   COORDINATION OF CARE: At 400 PM Discussed treatment plan with patient which includes epinephrine, benadryl, pepcid, and solu-medrol. Patient agrees.    1. Allergic reaction to drug   2. Angioedema    New Prescriptions   EPINEPHRINE (EPIPEN) 0.3 MG/0.3 ML DEVI    Inject 0.3 mLs (0.3 mg total) into the muscle as needed.   FAMOTIDINE (PEPCID) 20 MG TABLET    Take 1 tablet (20 mg total) by mouth daily.   PREDNISONE (DELTASONE) 20 MG TABLET    Take 3 po QD x 2d starting tomorrow, then 2 po QD  x 3d then 1 po QD x 3d   SULFAMETHOXAZOLE-TRIMETHOPRIM (SEPTRA DS) 800-160 MG PER TABLET    Take 1 tablet by mouth every 12 (twelve) hours.   Plan discharge  Devoria Albe, MD, FACEP  CRITICAL CARE Performed by: Devoria Albe L   Total critical care time:42 min Critical care time was exclusive of separately billable procedures and treating other patients.  Critical care was necessary to treat or prevent imminent or life-threatening deterioration.  Critical care was time spent personally by me on the following activities: development of treatment plan with patient and/or surrogate as well as nursing, discussions with consultants, evaluation of patient's response to treatment, examination of patient, obtaining history from patient or surrogate, ordering and performing treatments and interventions, ordering and review of laboratory studies, ordering and review of radiographic studies, pulse oximetry and re-evaluation of patient's condition.    MDM  I personally performed the services described in this documentation, which was scribed in my presence. The recorded information has been reviewed and considered.  Devoria Albe, MD, Armando Gang          Ward Givens, MD 12/25/11 2012

## 2011-12-25 NOTE — Progress Notes (Signed)
Orthopedic Tech Progress Note Patient Details:  Theresa Mann October 10, 1970 [id number] Post op shoe applied to Left foot. Patient tolerated well. Application and care instruction provided. Ortho Devices Type of Ortho Device: Postop boot Ortho Device/Splint Location: Left foot Ortho Device/Splint Interventions: Application   Asia R Thompson 12/25/2011, 8:39 AM

## 2011-12-25 NOTE — ED Notes (Addendum)
Pt reports was here yesterday for cellulitis and was placed on IV  Clindamycin; pt went home this AM, and cont to take clindamycin, and started to have swelling to L hand (where IV was and received abx), and then started to have swelling to tongue--about 15 minutes ago; pt able to have full conversations without difficulty; no swelling noted to back of throat

## 2011-12-25 NOTE — ED Notes (Signed)
ORDERED BREAKFAST TRAY 

## 2011-12-30 LAB — CULTURE, BLOOD (ROUTINE X 2)
Culture: NO GROWTH
Culture: NO GROWTH

## 2011-12-30 NOTE — ED Provider Notes (Signed)
Medical screening examination/treatment/procedure(s) were conducted as a shared visit with non-physician practitioner(s) and myself.  I personally evaluated the patient during the encounter  Nickol Collister K Linker, MD 12/30/11 0915 

## 2011-12-30 NOTE — ED Provider Notes (Signed)
Medical screening examination/treatment/procedure(s) were conducted as a shared visit with non-physician practitioner(s) and myself.  I personally evaluated the patient during the encounter  [name], MD 12/30/11 [phone]

## 2011-12-30 NOTE — ED Provider Notes (Signed)
Medical screening examination/treatment/procedure(s) were conducted as a shared visit with non-physician practitioner(s) and myself.  I personally evaluated the patient during the encounter  [name], MD 12/30/11 [phone]

## 2013-04-26 ENCOUNTER — Emergency Department (HOSPITAL_BASED_OUTPATIENT_CLINIC_OR_DEPARTMENT_OTHER): Payer: 59

## 2013-04-26 ENCOUNTER — Encounter (HOSPITAL_BASED_OUTPATIENT_CLINIC_OR_DEPARTMENT_OTHER): Payer: Self-pay | Admitting: Emergency Medicine

## 2013-04-26 ENCOUNTER — Emergency Department (HOSPITAL_BASED_OUTPATIENT_CLINIC_OR_DEPARTMENT_OTHER)
Admission: EM | Admit: 2013-04-26 | Discharge: 2013-04-26 | Disposition: A | Discharge status: Home / Self Care | Payer: 59 | Attending: Emergency Medicine | Admitting: Emergency Medicine

## 2013-04-26 DIAGNOSIS — T50901A Poisoning by unspecified drugs, medicaments and biological substances, accidental (unintentional), initial encounter: Secondary | ICD-10-CM | POA: Insufficient documentation

## 2013-04-26 DIAGNOSIS — R220 Localized swelling, mass and lump, head: Secondary | ICD-10-CM | POA: Insufficient documentation

## 2013-04-26 DIAGNOSIS — Z79899 Other long term (current) drug therapy: Secondary | ICD-10-CM | POA: Insufficient documentation

## 2013-04-26 DIAGNOSIS — Y939 Activity, unspecified: Secondary | ICD-10-CM | POA: Insufficient documentation

## 2013-04-26 DIAGNOSIS — E079 Disorder of thyroid, unspecified: Secondary | ICD-10-CM | POA: Insufficient documentation

## 2013-04-26 DIAGNOSIS — Z880 Allergy status to penicillin: Secondary | ICD-10-CM | POA: Insufficient documentation

## 2013-04-26 DIAGNOSIS — R221 Localized swelling, mass and lump, neck: Secondary | ICD-10-CM

## 2013-04-26 DIAGNOSIS — T783XXA Angioneurotic edema, initial encounter: Secondary | ICD-10-CM

## 2013-04-26 DIAGNOSIS — Y929 Unspecified place or not applicable: Secondary | ICD-10-CM | POA: Insufficient documentation

## 2013-04-26 DIAGNOSIS — Z792 Long term (current) use of antibiotics: Secondary | ICD-10-CM | POA: Insufficient documentation

## 2013-04-26 MED ORDER — PREDNISONE 10 MG PO TABS: ORAL_TABLET | ORAL | Status: DC

## 2013-04-26 NOTE — Discharge Instructions (Signed)
Angioedema Angioedema is a sudden swelling of tissues, often of the skin. It can occur on the face or genitals or in the abdomen or other body parts. The swelling usually develops over a short period and gets better in 24 to 48 hours. It often begins during the night and is found when the person wakes up. The person may also get red, itchy patches of skin (hives). Angioedema can be dangerous if it involves swelling of the air passages.  Depending on the cause, episodes of angioedema may only happen once, come back in unpredictable patterns, or repeat for several years and then gradually fade away.  CAUSES  Angioedema can be caused by an allergic reaction to various triggers. It can also result from nonallergic causes, including reactions to drugs, immune system disorders, viral infections, or an abnormal gene that is passed to you from your parents (hereditary). For some people with angioedema, the cause is unknown.  Some things that can trigger angioedema include:   Foods.   Medicines, such as ACE inhibitors, ARBs, nonsteroidal anti-inflammatory agents, or estrogen.   Latex.   Animal saliva.   Insect stings.   Dyes used in X-rays.   Mild injury.   Dental work.  Surgery.  Stress.   Sudden changes in temperature.   Exercise. SIGNS AND SYMPTOMS   Swelling of the skin.  Hives. If these are present, there is also intense itching.  Redness in the affected area.   Pain in the affected area.  Swollen lips or tongue.  Breathing problems. This may happen if the air passages swell.  Wheezing. If internal organs are involved, there may be:   Nausea.   Abdominal pain.   Vomiting.   Difficulty swallowing.   Difficulty passing urine. DIAGNOSIS   Your health care provider will examine the affected area and take a medical and family history.  Various tests may be done to help determine the cause. Tests may include:  Allergy skin tests to see if the problem  is an allergic reaction.   Blood tests to check for hereditary angioedema.   Tests to check for underlying diseases that could cause the condition.   A review of your medicines, including over the counter medicines, may be done. TREATMENT  Treatment will depend on the cause of the angioedema. Possible treatments include:   Removal of anything that triggered the condition (such as stopping certain medicines).   Medicines to treat symptoms or prevent attacks. Medicines given may include:   Antihistamines.   Epinephrine injection.   Steroids.   Hospitalization may be required for severe attacks. If the air passages are affected, it can be an emergency. Tubes may need to be placed to keep the airway open. HOME CARE INSTRUCTIONS   Only take over-the-counter or prescription medicines as directed by your health care provider.  If you were given medicines for emergency allergy treatment, always carry them with you.  Wear a medical bracelet as directed by your health care provider.   Avoid known triggers. SEEK MEDICAL CARE IF:   You have repeat attacks of angioedema.   Your attacks are more frequent or more severe despite preventive measures.   You have hereditary angioedema and are considering having children. It is important to discuss the risks of passing the condition on to your children with your health care provider. SEEK IMMEDIATE MEDICAL CARE IF:   You have severe swelling of the mouth, tongue, or lips.  You have difficulty breathing.   You have difficulty swallowing.     You faint. MAKE SURE YOU:  Understand these instructions.  Will watch your condition.  Will get help right away if you are not doing well or get worse. Document Released: 05/04/2001 Document Revised: 12/14/2012 Document Reviewed: 10/17/2012 [employer] Patient Information 2014 [employer].  

## 2013-04-26 NOTE — ED Notes (Signed)
Pt c/o right side weakness x 2 days also c/o right side facial swelling x 3 days HX of same

## 2013-04-26 NOTE — ED Provider Notes (Signed)
CSN: [id number]     Arrival date & time 04/26/13  1409 History   First MD Initiated Contact with Patient 04/26/13 1603     Chief Complaint  Patient presents with  . Weakness     (Consider location/radiation/quality/duration/timing/severity/associated sxs/prior Treatment) Patient is a 43 y.o. female presenting with weakness. No language interpreter was used.  Weakness This is a new problem. Episode onset: 3. The problem occurs constantly. The problem has been rapidly improving. Associated symptoms include weakness. Pertinent negatives include no abdominal pain. Nothing aggravates the symptoms. She has tried nothing for the symptoms. The treatment provided no relief.  Pt complains of swelling to right side of her face.  Pt reports she has had multiple episodes in the past  Past Medical History  Diagnosis Date  . Allergic   . Thyroid disease    Past Surgical History  Procedure Laterality Date  . Cholecystectomy     History reviewed. No pertinent family history. History  Substance Use Topics  . Smoking status: Never Smoker   . Smokeless tobacco: Not on file  . Alcohol Use: No   OB History   Grav Para Term Preterm Abortions TAB SAB Ect Mult Living                 Review of Systems  Gastrointestinal: Negative for abdominal pain.  Neurological: Positive for weakness.  All other systems reviewed and are negative.      Allergies  Penicillins  Home Medications   Current Outpatient Rx  Name  Route  Sig  Dispense  Refill  . acetaminophen (TYLENOL) 325 MG tablet   Oral   Take 650 mg by mouth every 6 (six) hours as needed. For pain         . cetirizine (ZYRTEC) 10 MG tablet   Oral   Take 10 mg by mouth daily as needed. For allergies         . CLINDAMYCIN HCL PO   Oral   Take 1 capsule by mouth 2 (two) times daily. Starting 12/25/11 for 7 days         . EPINEPHrine (EPI-PEN) 0.3 mg/0.3 mL DEVI   Intramuscular   Inject 0.3 mg into the muscle once.         Marland Kitchen  EPINEPHrine (EPIPEN) 0.3 mg/0.3 mL DEVI   Intramuscular   Inject 0.3 mLs (0.3 mg total) into the muscle as needed.   2 Device   0   . famotidine (PEPCID) 20 MG tablet   Oral   Take 1 tablet (20 mg total) by mouth daily.   18 tablet   0   . HYDROcodone-acetaminophen (NORCO/VICODIN) 5-325 MG per tablet   Oral   Take 1-2 tablets by mouth every 4 (four) hours as needed for pain.   20 tablet   0   . ibuprofen (ADVIL,MOTRIN) 200 MG tablet   Oral   Take 400 mg by mouth every 6 (six) hours as needed. For pain         . levothyroxine (SYNTHROID, LEVOTHROID) 125 MCG tablet   Oral   Take 250 mcg by mouth daily.         . predniSONE (DELTASONE) 20 MG tablet      Take 3 po QD x 2d starting tomorrow, then 2 po QD x 3d then 1 po QD x 3d   15 tablet   0   . sulfamethoxazole-trimethoprim (SEPTRA DS) 800-160 MG per tablet   Oral   Take 1 tablet by  mouth every 12 (twelve) hours.   20 tablet   0    BP 176/90  Pulse 76  Temp(Src) 98.3 F (36.8 C)  Resp 16  Ht 5\' 8"  (1.727 m)  Wt 245 lb (111.131 kg)  BMI 37.26 kg/m2  SpO2 100% Physical Exam  Nursing note and vitals reviewed. Constitutional: She is oriented to person, place, and time. She appears well-developed and well-nourished.  HENT:  Head: Normocephalic and atraumatic.  Right Ear: External ear normal.  Left Ear: External ear normal.  Nose: Nose normal.  Mouth/Throat: Oropharynx is clear and moist.  Slight swelling right mouth  Eyes: Conjunctivae and EOM are normal. Pupils are equal, round, and reactive to light.  Neck: Normal range of motion.  Pulmonary/Chest: Effort normal.  Abdominal: Soft. She exhibits no distension.  Musculoskeletal: Normal range of motion.  Neurological: She is alert and oriented to person, place, and time.  Skin: Skin is warm.  Psychiatric: She has a normal mood and affect.    ED Course  Procedures (including critical care time) Labs Review Labs Reviewed - No data to display Imaging  Review Ct Head Wo Contrast  04/26/2013   CLINICAL DATA:  Weakness, right facial swelling.  EXAM: CT HEAD WITHOUT CONTRAST  TECHNIQUE: Contiguous axial images were obtained from the base of the skull through the vertex without intravenous contrast.  COMPARISON:  None.  FINDINGS: Bony calvarium appears to be intact. No mass effect or midline shift is noted. Ventricular size is within normal limits. There is no evidence of mass lesion, hemorrhage or acute infarction.  IMPRESSION: No gross intracranial abnormality seen.   Electronically Signed   By: Sabino Dick M.D.   On: 04/26/2013 14:47    EKG Interpretation   None       MDM   Final diagnoses:  Angioedema of lips    Pt reports she has had multiple episodes of this.  Pt has had extensive work ups.   Pt reports she usually responds well to prednisone.    [place], PA-C 04/26/13 1625

## 2013-04-26 NOTE — ED Provider Notes (Signed)
Medical screening examination/treatment/procedure(s) were performed by non-physician practitioner and as supervising physician I was immediately available for consultation/collaboration.  EKG Interpretation   None         Merryl Hacker, MD 04/26/13 2349

## 2015-03-06 ENCOUNTER — Ambulatory Visit: Payer: Self-pay | Admitting: Podiatry

## 2015-03-10 DIAGNOSIS — R609 Edema, unspecified: Secondary | ICD-10-CM

## 2015-03-10 HISTORY — DX: Edema, unspecified: R60.9

## 2015-03-13 ENCOUNTER — Inpatient Hospital Stay: Admission: RE | Admit: 2015-03-13 | Payer: Self-pay | Source: Ambulatory Visit

## 2015-03-13 ENCOUNTER — Ambulatory Visit (INDEPENDENT_AMBULATORY_CARE_PROVIDER_SITE_OTHER): Payer: 59 | Admitting: Podiatry

## 2015-03-13 ENCOUNTER — Other Ambulatory Visit: Payer: Self-pay

## 2015-03-13 ENCOUNTER — Encounter: Payer: Self-pay | Admitting: Podiatry

## 2015-03-13 VITALS — BP 132/95 | HR 93 | Ht 69.0 in | Wt 241.0 lb

## 2015-03-13 DIAGNOSIS — M205X9 Other deformities of toe(s) (acquired), unspecified foot: Secondary | ICD-10-CM | POA: Diagnosis not present

## 2015-03-13 DIAGNOSIS — M79606 Pain in leg, unspecified: Secondary | ICD-10-CM

## 2015-03-13 DIAGNOSIS — M21969 Unspecified acquired deformity of unspecified lower leg: Secondary | ICD-10-CM | POA: Diagnosis not present

## 2015-03-13 DIAGNOSIS — M21619 Bunion of unspecified foot: Secondary | ICD-10-CM

## 2015-03-13 DIAGNOSIS — M79673 Pain in unspecified foot: Secondary | ICD-10-CM

## 2015-03-13 DIAGNOSIS — M2020 Hallux rigidus, unspecified foot: Secondary | ICD-10-CM

## 2015-03-13 NOTE — Progress Notes (Signed)
SUBJECTIVE: 45 y.o. year old female presents bilateral foot pain off and on x 3 years. Mostly instep and heels with pain and swelling. On feet 3-4 hours a day. Wears flat dress shoes. Having difficulty wearing too high or too flat shoes. Was told she has plantar fasciitis, cellulitis, and was treated with prednisone and antibiotics.  Past treatment failed to improve her condition. Pain can go up to legs. Been checked out for blood clots.   REVIEW OF SYSTEMS: Constitutional: negative for anorexia, chills, fevers, night sweats and weight loss Eyes: Corrective lens only. Ears, nose, mouth, throat, and face: negative Respiratory: negative Cardiovascular: negative Gastrointestinal: negative Genitourinary:negative Integument/breast: negative Hematologic/lymphatic: negative Musculoskeletal:negative Neurological: negative Endocrine: Takes thyroid pills. Allergic/Immunologic: Clindamycin and mild.  OBJECTIVE: DERMATOLOGIC EXAMINATION: Normal findings. No abnormal lesions noted.  VASCULAR EXAMINATION OF LOWER LIMBS: Pedal pulses: All pedal pulses are palpable with normal pulsation.  Capillary Filling times within 3 seconds in all digits.  No edema or erythema noted. Temperature gradient from tibial crest to dorsum of foot is within normal bilateral.  NEUROLOGIC EXAMINATION OF THE LOWER LIMBS: All epicritic and tactile sensations grossly intact.   MUSCULOSKELETAL EXAMINATION: Positive for bilateral bunion, elevated first ray, limited joint motion at the first MPJ bilateral. Pain at the first MPJ left with range of motion.  Normal ankle joint dorsiflexion and plantar flexion noted bilateral. Normal STJ motion noted bilateral.  RADIOGRAPHIC FINDINGS: High arched cavus type foot with plantar calcaneal spur bilateral. Anterior break in CYMA line bilateral. Severely elevated first metatarsal bilateral. Flattening metatarsal head with narrowing of the joint space first MPJ  bilateral. Normal first metatarsal length bilateral.  No acute changes seen in osseous or articular surface forefoot or rear foot.   ASSESSMENT: Osteoarthropathy first MPJ bilateral. Hallux limitus bilateral. Elevated first ray bilateral. Plantar fasciitis bilateral.  PLAN: Reviewed clinical findings and available treatment options, change in shoe gear and activity, orthotics, surgical options. May benefit from [place] bunionectomy left foot at this time.

## 2015-03-13 NOTE — Patient Instructions (Signed)
Seen for bilateral foot pain. Noted of elevated first metatarsal with bunion and limited joint motion. May benefit from [place] bunionectomy left foot at this time.

## 2015-03-28 ENCOUNTER — Ambulatory Visit: Payer: 59 | Admitting: Allergy and Immunology

## 2015-06-06 ENCOUNTER — Encounter (HOSPITAL_COMMUNITY): Payer: Self-pay | Admitting: *Deleted

## 2015-06-06 ENCOUNTER — Observation Stay (HOSPITAL_COMMUNITY)
Admission: EM | Admit: 2015-06-06 | Discharge: 2015-06-08 | Disposition: A | Discharge status: Home / Self Care | Payer: 59 | Attending: Internal Medicine | Admitting: Internal Medicine

## 2015-06-06 DIAGNOSIS — T783XXA Angioneurotic edema, initial encounter: Secondary | ICD-10-CM | POA: Diagnosis not present

## 2015-06-06 DIAGNOSIS — X58XXXA Exposure to other specified factors, initial encounter: Secondary | ICD-10-CM | POA: Diagnosis not present

## 2015-06-06 DIAGNOSIS — E039 Hypothyroidism, unspecified: Secondary | ICD-10-CM | POA: Insufficient documentation

## 2015-06-06 DIAGNOSIS — T7840XA Allergy, unspecified, initial encounter: Secondary | ICD-10-CM | POA: Diagnosis present

## 2015-06-06 DIAGNOSIS — M7989 Other specified soft tissue disorders: Secondary | ICD-10-CM | POA: Insufficient documentation

## 2015-06-06 MED ORDER — DIPHENHYDRAMINE HCL 25 MG PO CAPS
25.0000 mg | ORAL_CAPSULE | Freq: Once | ORAL | Status: AC
  Administered 2015-06-06: 25 mg via ORAL

## 2015-06-06 MED ORDER — DIPHENHYDRAMINE HCL 50 MG/ML IJ SOLN
12.5000 mg | Freq: Once | INTRAMUSCULAR | Status: AC
  Administered 2015-06-06: 12.5 mg via INTRAVENOUS
  Filled 2015-06-06: qty 1

## 2015-06-06 MED ORDER — SODIUM CHLORIDE 0.9 % IV SOLN
INTRAVENOUS | Status: DC
  Administered 2015-06-07: 01:00:00 via INTRAVENOUS

## 2015-06-06 MED ORDER — DIPHENHYDRAMINE HCL 25 MG PO CAPS
ORAL_CAPSULE | ORAL | Status: AC
  Filled 2015-06-06: qty 1

## 2015-06-06 MED ORDER — FAMOTIDINE IN NACL 20-0.9 MG/50ML-% IV SOLN
20.0000 mg | Freq: Once | INTRAVENOUS | Status: AC
  Administered 2015-06-06: 20 mg via INTRAVENOUS
  Filled 2015-06-06: qty 50

## 2015-06-06 MED ORDER — ONDANSETRON HCL 4 MG/2ML IJ SOLN
4.0000 mg | Freq: Once | INTRAMUSCULAR | Status: AC
  Administered 2015-06-06: 4 mg via INTRAVENOUS
  Filled 2015-06-06: qty 2

## 2015-06-06 MED ORDER — DEXAMETHASONE SODIUM PHOSPHATE 10 MG/ML IJ SOLN
10.0000 mg | Freq: Once | INTRAMUSCULAR | Status: AC
  Administered 2015-06-06: 10 mg via INTRAVENOUS
  Filled 2015-06-06: qty 1

## 2015-06-06 MED ORDER — SODIUM CHLORIDE 0.9 % IV BOLUS (SEPSIS)
1000.0000 mL | Freq: Once | INTRAVENOUS | Status: AC
  Administered 2015-06-06: 1000 mL via INTRAVENOUS

## 2015-06-06 NOTE — ED Notes (Signed)
Reviewed case with Dr. Regenia Skeeter and Trude Mcburney, PA-C.

## 2015-06-06 NOTE — ED Notes (Signed)
Patient not in room

## 2015-06-06 NOTE — ED Notes (Signed)
Patient reports around 2pm she noticed her hand started to swell. Then later her lower lip. Sometimes it goes away, but today, it still didn't.

## 2015-06-06 NOTE — ED Notes (Signed)
Patient denies any changes at home, does not take lisinopril. She reports this being sixth episode for same problem.

## 2015-06-06 NOTE — ED Notes (Signed)
Pt is here for allergic reaction with marked swelling to bottom lip.  Pt began having swelling to right hand around 2pm followed by lip swelling around 5pm.  Pt does not know what caused this.  Pt was seen at [facility] and was given steriod shot and she took unknown amount of benadryl (she took a sip of childrens benadryl-unknown dose).  Pt is here as she continued to have swelling to the mouth, lip and hand.  No acute distress or sob. Pt denies any swelling to throat

## 2015-06-06 NOTE — ED Notes (Signed)
Phlebotomy at the bedside. Lower lip still appearing swollen.

## 2015-06-07 ENCOUNTER — Encounter (HOSPITAL_COMMUNITY): Payer: Self-pay | Admitting: Family Medicine

## 2015-06-07 DIAGNOSIS — T783XXA Angioneurotic edema, initial encounter: Secondary | ICD-10-CM | POA: Diagnosis not present

## 2015-06-07 DIAGNOSIS — T783XXD Angioneurotic edema, subsequent encounter: Secondary | ICD-10-CM

## 2015-06-07 LAB — GLUCOSE, CAPILLARY: Glucose-Capillary: 128 mg/dL — ABNORMAL HIGH (ref 65–99)

## 2015-06-07 LAB — CBC WITH DIFFERENTIAL/PLATELET
Basophils Absolute: 0 10*3/uL (ref 0.0–0.1)
Basophils Relative: 0 %
Eosinophils Absolute: 0 10*3/uL (ref 0.0–0.7)
Eosinophils Relative: 0 %
HCT: 31.7 % — ABNORMAL LOW (ref 36.0–46.0)
Hemoglobin: 10.1 g/dL — ABNORMAL LOW (ref 12.0–15.0)
Lymphocytes Relative: 6 %
Lymphs Abs: 0.7 10*3/uL (ref 0.7–4.0)
MCH: 24.2 pg — ABNORMAL LOW (ref 26.0–34.0)
MCHC: 31.9 g/dL (ref 30.0–36.0)
MCV: 75.8 fL — ABNORMAL LOW (ref 78.0–100.0)
Monocytes Absolute: 0.1 10*3/uL (ref 0.1–1.0)
Monocytes Relative: 1 %
Neutro Abs: 10.2 10*3/uL — ABNORMAL HIGH (ref 1.7–7.7)
Neutrophils Relative %: 93 %
Platelets: 292 10*3/uL (ref 150–400)
RBC: 4.18 MIL/uL (ref 3.87–5.11)
RDW: 15.1 % (ref 11.5–15.5)
WBC: 11 10*3/uL — ABNORMAL HIGH (ref 4.0–10.5)

## 2015-06-07 LAB — BASIC METABOLIC PANEL
Anion gap: 8 (ref 5–15)
BUN: 7 mg/dL (ref 6–20)
CO2: 25 mmol/L (ref 22–32)
Calcium: 9 mg/dL (ref 8.9–10.3)
Chloride: 105 mmol/L (ref 101–111)
Creatinine, Ser: 0.83 mg/dL (ref 0.44–1.00)
GFR calc Af Amer: >60 mL/min (ref >60)
GFR calc non Af Amer: >60 mL/min (ref >60)
Glucose, Bld: 130 mg/dL — ABNORMAL HIGH (ref 65–99)
Potassium: 3.9 mmol/L (ref 3.5–5.1)
Sodium: 138 mmol/L (ref 135–145)

## 2015-06-07 LAB — MRSA PCR SCREENING: MRSA by PCR: NEGATIVE

## 2015-06-07 MED ORDER — LEVOTHYROXINE SODIUM 100 MCG PO TABS
300.0000 ug | ORAL_TABLET | Freq: Every day | ORAL | Status: DC
  Administered 2015-06-07 – 2015-06-08 (×2): 300 ug via ORAL
  Filled 2015-06-07: qty 2
  Filled 2015-06-07: qty 4
  Filled 2015-06-07: qty 2
  Filled 2015-06-07: qty 3

## 2015-06-07 MED ORDER — DIPHENHYDRAMINE HCL 50 MG/ML IJ SOLN: 25.0000 mg | Freq: Four times a day (QID) | INTRAMUSCULAR | Status: DC | PRN

## 2015-06-07 MED ORDER — SODIUM CHLORIDE 0.9% FLUSH
3.0000 mL | Freq: Two times a day (BID) | INTRAVENOUS | Status: DC
  Administered 2015-06-07 (×3): 3 mL via INTRAVENOUS

## 2015-06-07 MED ORDER — DEXAMETHASONE SODIUM PHOSPHATE 4 MG/ML IJ SOLN
8.0000 mg | INTRAMUSCULAR | Status: AC
Start: 2015-06-07 — End: 2015-06-07
  Administered 2015-06-07 (×5): 8 mg via INTRAVENOUS
  Filled 2015-06-07 (×5): qty 2

## 2015-06-07 MED ORDER — ACETAMINOPHEN 325 MG PO TABS
650.0000 mg | ORAL_TABLET | Freq: Four times a day (QID) | ORAL | Status: DC | PRN
  Administered 2015-06-07: 650 mg via ORAL
  Filled 2015-06-07: qty 2

## 2015-06-07 MED ORDER — PREDNISONE 20 MG PO TABS
40.0000 mg | ORAL_TABLET | Freq: Every day | ORAL | Status: DC
  Administered 2015-06-08: 40 mg via ORAL
  Filled 2015-06-07: qty 2

## 2015-06-07 MED ORDER — FAMOTIDINE IN NACL 20-0.9 MG/50ML-% IV SOLN
20.0000 mg | Freq: Two times a day (BID) | INTRAVENOUS | Status: DC
  Administered 2015-06-07 (×2): 20 mg via INTRAVENOUS
  Filled 2015-06-07 (×4): qty 50

## 2015-06-07 MED ORDER — LORATADINE 10 MG PO TABS
10.0000 mg | ORAL_TABLET | Freq: Every day | ORAL | Status: DC
  Administered 2015-06-08: 10 mg via ORAL
  Filled 2015-06-07: qty 1

## 2015-06-07 MED ORDER — HYDROCODONE-ACETAMINOPHEN 5-325 MG PO TABS
1.0000 | ORAL_TABLET | ORAL | Status: DC | PRN
  Administered 2015-06-07 (×4): 1 via ORAL
  Filled 2015-06-07 (×4): qty 1

## 2015-06-07 NOTE — Progress Notes (Addendum)
[place] TEAM 1 - Stepdown/ICU TEAM PROGRESS NOTE  Theresa Mann [id number] DOB: October 24, 1970 DOA: 06/06/2015 PCP: Default, Provider, MD  Admit HPI / Brief Narrative: 45 y.o. female with PMH of hypothyroidism and recurrent idiopathic angioedema who presented to the ED with swelling of the right hand, feet, and lower lip. Patient reported this is the sixth time she's had these symptoms. She reported waking with bilateral feet swelling, noting swelling of the right hand at approximately 2 PM, and then swelling of the lower lip at approximately 5 PM. She went to urgent care for evaluation and was treated with a steroid and Benadryl. Upon returning home, her lip swelling continued to worsen and she presented to the emergency department. She reports undergoing extensive outpatient workup with an allergist which ultimately was unrevealing. She has a positive family history of idiopathic angioedema in 2 relatives. She does not take antihistamines scheduled at home, but will use Benadryl as needed for swelling. She reports history of tongue and throat swelling, but has never required endotracheal intubation. Her symptoms typically respond quickly to steroid and antihistamines, but today is the first time that they have not.  In ED, patient was found to be afebrile, saturating well on room air, with vital signs stable. She was treated with a 1 L bolus of normal saline, 10 mg IV push of Decadron, IV Benadryl 2, and 20 mg IV Pepcid. Despite these measures, there was no appreciable improvement in her symptoms. She never did develop tongue or airway swelling and her respirations remained unlabored.   HPI/Subjective: Pt seen for f/u visit.  Assessment/Plan:  Angioedema  - most c/w recurrent histamine mediated angioedema  - Reports this is her 6th episode; she has 2 relatives with same  - Reports outpt w/u through allergist was negative for both allergy and hereditary angioedema   - There is no  tongue swelling or airway compromise - Continue BID Pepcid, Benadryl, Decadron  - recommend taking scheduled anti-histamines outpt   Hypothyroidism  - Appears to be stable  - Continue home-dose Synthroid  Code Status: FULL Family Communication: no family present at time of exam Disposition Plan: d/c in AM likely   Consultants: none  Procedures: none  Antibiotics: none  DVT prophylaxis: SCDs  Objective: Blood pressure 130/80, pulse 71, temperature 98.7 F (37.1 C), temperature source Oral, resp. rate 15, height 5\' 8"  (1.727 m), weight 111.1 kg (244 lb 14.9 oz), SpO2 99 %.  Intake/Output Summary (Last 24 hours) at 06/07/15 1637 Last data filed at 06/07/15 1300  Gross per 24 hour  Intake   1013 ml  Output   1100 ml  Net    -87 ml     Exam: Pt seen for f/u visit.  Data Reviewed: Basic Metabolic Panel:  Recent Labs Lab 06/07/15 0026  NA 138  K 3.9  CL 105  CO2 25  GLUCOSE 130*  BUN 7  CREATININE 0.83  CALCIUM 9.0    CBC:  Recent Labs Lab 06/07/15 0026  WBC 11.0*  NEUTROABS 10.2*  HGB 10.1*  HCT 31.7*  MCV 75.8*  PLT 292   CBG:  Recent Labs Lab 06/07/15 0845  GLUCAP 128*    Recent Results (from the past 240 hour(s))  MRSA PCR Screening     Status: None   Collection Time: 06/07/15  2:23 AM  Result Value Ref Range Status   MRSA by PCR NEGATIVE NEGATIVE Final    Comment:        The GeneXpert MRSA  Assay (FDA approved for NASAL specimens only), is one component of a comprehensive MRSA colonization surveillance program. It is not intended to diagnose MRSA infection nor to guide or monitor treatment for MRSA infections.      Studies:   Recent x-ray studies have been reviewed in detail by the Attending Physician  Scheduled Meds:  Scheduled Meds: . dexamethasone  8 mg Intravenous 6 times per day  . famotidine (PEPCID) IV  20 mg Intravenous Q12H  . levothyroxine  300 mcg Oral QAC breakfast  . sodium chloride flush  3 mL  Intravenous Q12H    Time spent on care of this patient: No charge   Cherene Altes , MD   [facility] Office  [phone] Pager - Text Page per Shea Evans as per below:  On-Call/Text Page:      [email or web address]      password [email or web address]  If 7PM-7AM, please contact night-coverage [email or web address] Password [facility] 06/07/2015, 4:37 PM

## 2015-06-07 NOTE — ED Provider Notes (Signed)
CSN: [id number]     Arrival date & time 06/06/15  2011 History   First MD Initiated Contact with Patient 06/06/15 2057     Chief Complaint  Patient presents with  . Allergic Reaction     (Consider location/radiation/quality/duration/timing/severity/associated sxs/prior Treatment) HPI  Theresa Mann Is a 45 year old female who presents emergency Department with chief complaint of allergic reaction. The patient has been evaluated and had previous episodes of lip, tongue and airway swelling. She hasn't unknown source. She's also been tested for hereditary angioedema. All of her testing has been negative to this point. Patient states that today while at work around 2:30 her right hand swelled up. This was followed by swelling in her lip. Patient went to an urgent care and had a shot of Solu-Medrol and Benadryl. Her symptoms seemed to improve. However, her lip swelling and hand swelling worsened afterward and she came to the emergency department. She states she is given by mouth Benadryl in the waiting room and her lip swelling seemed to improve somewhat, however, has not changed since that time. Her right hand swelling has worsened. Patient states that prior to all of these events. She had some bilateral feet swelling which is also common with her angioedema episodes. She has never been intubated but she states that she has had tongue and throat swelling. She denies any currently. She denies wheezing or shortness of breath. The patient states that normally her angioedema responds rapidly to IV steroids.  Past Medical History  Diagnosis Date  . Allergic   . Thyroid disease    Past Surgical History  Procedure Laterality Date  . Cholecystectomy     No family history on file. Social History  Substance Use Topics  . Smoking status: Never Smoker   . Smokeless tobacco: Never Used  . Alcohol Use: No   OB History    No data available     Review of Systems  Ten systems reviewed and are  negative for acute change, except as noted in the HPI.    Allergies  Clindamycin/lincomycin; Milk-related compounds; and Pollen extract  Home Medications   Prior to Admission medications   Medication Sig Start Date End Date Taking? Authorizing Provider  acetaminophen (TYLENOL) 325 MG tablet Take 650 mg by mouth every 6 (six) hours as needed. For pain   Yes Historical Provider, MD  diphenhydramine-acetaminophen (TYLENOL PM) 25-500 MG TABS tablet Take 1 tablet by mouth at bedtime as needed.   Yes Historical Provider, MD  levothyroxine (SYNTHROID, LEVOTHROID) 300 MCG tablet Take 300 mcg by mouth daily.  01/29/15  Yes Historical Provider, MD  famotidine (PEPCID) 20 MG tablet Take 1 tablet (20 mg total) by mouth daily. Patient not taking: Reported on 06/06/2015 12/25/11   Rolland Porter, MD   BP 110/72 mmHg  Pulse 76  Temp(Src) 98.9 F (37.2 C) (Oral)  Resp 17  SpO2 99% Physical Exam  Constitutional: She is oriented to person, place, and time. She appears well-developed and well-nourished. No distress.  HENT:  Head: Normocephalic and atraumatic.  Mouth/Throat: Oropharynx is clear and moist.  Bottom lip with significant swelling and angioedema. No sublingual swelling, oropharynx is clear. No stridor  Eyes: Conjunctivae are normal. No scleral icterus.  Neck: Normal range of motion.  Cardiovascular: Normal rate, regular rhythm and normal heart sounds.  Exam reveals no gallop and no friction rub.   No murmur heard. Pulmonary/Chest: Effort normal and breath sounds normal. No respiratory distress.  Abdominal: Soft. Bowel sounds are normal. She  exhibits no distension and no mass. There is no tenderness. There is no guarding.  Musculoskeletal:       Arms: Neurological: She is alert and oriented to person, place, and time.  Skin: Skin is warm and dry. She is not diaphoretic.  Nursing note and vitals reviewed.   ED Course  Procedures (including critical care time) Labs Review Labs Reviewed    BASIC METABOLIC PANEL  CBC WITH DIFFERENTIAL/PLATELET    Imaging Review No results found. I have personally reviewed and evaluated these images and lab results as part of my medical decision-making.   EKG Interpretation None      MDM   Final diagnoses:  Allergic reaction, initial encounter  Angioedema, initial encounter    Patient with angioedema, unknown trigger. She has not improved with IV Decadron, Benadryl and Pepcid. Patient had airway has been patent the entire time without worsening of her symptoms. Given her history of tongue swelling and throat swelling without improvement. Patient needs overnight observation. I spoken with Dr. Candace Cruise. He'll admit the patient. She appears safe for admission at this time. Patient agrees with plan of care    Margarita Mail, PA-C 06/07/15 [id number]  Sherwood Gambler, MD 06/11/15 [phone]

## 2015-06-07 NOTE — ED Notes (Signed)
Admitting physician at bedside at this time.

## 2015-06-07 NOTE — H&P (Signed)
[facility] History and Physical  Theresa Mann [id number] DOB: 02-Jul-1970 DOA: 06/06/2015  Referring physician: ED physician PCP: Default, Provider, MD  Specialists: None listed  Chief Complaint:  Hand and lip swelling   HPI: Theresa Mann is a 45 y.o. female with PMH of hypothyroidism and recurrent idiopathic angioedema who presents to the ED with swelling of the right hand, feet, and lower lip. Patient reports that this is the sixth time she's had these symptoms. She reports waking with bilateral feet swelling, noting swelling of the right hand at approximately 2 PM, and then swelling of the lower lip at approximately 5 PM. She went to urgent care for evaluation and was treated with a steroid and Benadryl. Upon returning home, her lip swelling was continuing to worsen and so she presented to the emergency department. She reports undergoing extensive outpatient workup with an allergist which ultimately was unrevealing. She has a positive family history of idiopathic angioedema in 2 relatives. She does not take antihistamines scheduled at home, but will use Benadryl as needed for swelling. She reports history of tongue and throat swelling, but is never required endotracheal intubation. Her symptoms typically respond quickly to steroid and antihistamines, but today is the first time that they have not.  In ED, patient was found to be afebrile, saturating well on room air, and with vital signs stable. She was treated with a 1 L bolus of normal saline, 10 mg IV push of Decadron, IV Benadryl 2, and 20 mg IV Pepcid. Despite these measures, there was no appreciable improvement in her symptoms. She never did develop tongue or airway swelling and her respirations have remained unlabored. Given the patient's history of swelling in the airway, and her lack of improvement despite aggressive treatment in the ED, she will be observed overnight in the stepdown unit with continued treatment.  Where  does patient live?   At home     Can patient participate in ADLs?  Yes       Review of Systems:   General: no fevers, chills, sweats, weight change, poor appetite, or fatigue HEENT: no blurry vision, hearing changes or sore throat Pulm: no dyspnea, cough, or wheeze CV: no chest pain or palpitations Abd: no nausea, vomiting, abdominal pain, diarrhea, or constipation GU: no dysuria, hematuria, increased urinary frequency, or urgency  Ext: no leg edema Neuro: no focal weakness, numbness, or tingling, no vision change or hearing loss Skin: no rash, no wounds MSK: No muscle spasm, no deformity, no red, hot, or swollen joint Heme: No easy bruising or bleeding Travel history: No recent long distant travel    Allergy:  Allergies  Allergen Reactions  . Clindamycin/Lincomycin Anaphylaxis  . Milk-Related Compounds     Throat scratchy  . Pollen Extract     Past Medical History  Diagnosis Date  . Allergic   . Thyroid disease     Past Surgical History  Procedure Laterality Date  . Cholecystectomy      Social History:  reports that she has never smoked. She has never used smokeless tobacco. She reports that she does not drink alcohol or use illicit drugs.  Family History: History reviewed. No pertinent family history.   Prior to Admission medications   Medication Sig Start Date End Date Taking? Authorizing Provider  acetaminophen (TYLENOL) 325 MG tablet Take 650 mg by mouth every 6 (six) hours as needed. For pain   Yes Historical Provider, MD  diphenhydramine-acetaminophen (TYLENOL PM) 25-500 MG TABS tablet Take 1 tablet  by mouth at bedtime as needed.   Yes Historical Provider, MD  levothyroxine (SYNTHROID, LEVOTHROID) 300 MCG tablet Take 300 mcg by mouth daily.  01/29/15  Yes Historical Provider, MD  famotidine (PEPCID) 20 MG tablet Take 1 tablet (20 mg total) by mouth daily. Patient not taking: Reported on 06/06/2015 12/25/11   Rolland Porter, MD    Physical Exam: Filed Vitals:    06/06/15 2245 06/06/15 2330 06/07/15 0000 06/07/15 0030  BP: 123/80 110/72 107/58 130/100  Pulse: 86 76 73 79  Temp:      TempSrc:      Resp:      SpO2: 100% 99% 98% 100%   General: Not in acute distress HEENT:       Eyes: PERRL, EOMI, no scleral icterus or conjunctival pallor.       ENT: No discharge from the ears or nose, lower lip markedly edematous, no swelling of tongue or oropharynx appreciated.        Neck: No JVD, no bruit, no appreciable mass Heme: No cervical adenopathy, no pallor Cardiac: S1/S2, RRR, No murmurs, No gallops or rubs. Pulm: Good air movement bilaterally. No rales, wheezing, rhonchi or rubs. Abd: Soft, nondistended, nontender, no rebound pain or gaurding, no mass or organomegaly, BS present. Ext: No LE edema bilaterally. 2+DP/PT pulse bilaterally. Right hand mild edema.  Musculoskeletal: No gross deformity, no red, hot, swollen joints, no limitation in ROM  Skin: No rashes or wounds on exposed surfaces  Neuro: Alert, oriented X3, cranial nerves II-XII grossly intact. No focal findings Psych: Patient is not overtly psychotic, appropriate mood and affect  Labs on Admission:  Basic Metabolic Panel: No results for input(s): NA, K, CL, CO2, GLUCOSE, BUN, CREATININE, CALCIUM, MG, PHOS in the last 168 hours. Liver Function Tests: No results for input(s): AST, ALT, ALKPHOS, BILITOT, PROT, ALBUMIN in the last 168 hours. No results for input(s): LIPASE, AMYLASE in the last 168 hours. No results for input(s): AMMONIA in the last 168 hours. CBC:  Recent Labs Lab 06/07/15 0026  WBC 11.0*  NEUTROABS 10.2*  HGB 10.1*  HCT 31.7*  MCV 75.8*  PLT 292   Cardiac Enzymes: No results for input(s): CKTOTAL, CKMB, CKMBINDEX, TROPONINI in the last 168 hours.  BNP (last 3 results) No results for input(s): BNP in the last 8760 hours.  ProBNP (last 3 results) No results for input(s): PROBNP in the last 8760 hours.  CBG: No results for input(s): GLUCAP in the last 168  hours.  Radiological Exams on Admission: No results found.  EKG:  Not done in ED, will obtain as appropriate   Assessment/Plan  1. Angioedema  - Reports this is her 6th episode; she has 2 relatives with same  - Reports outpt w/u through allergist was negative  - Only med is Synthroid, which has been associated with angioedema infrequently  - There is no tongue swelling or airway compromise, but she has history of such and has not improved at all with treatment, hence the admission  - Continue BID Pepcid, Benadryl, Decadron  - Would recommend taking scheduled anti-histamines outpt    2. Hypothyroidism  - Appears to be stable  - Continue home-dose Synthroid   DVT ppx:  SCDs  Code Status: Full code Family Communication: None at bed side.                Disposition Plan: Admit to inpatient   Date of Service 06/07/2015    Vianne Bulls, MD [facility] Pager [phone]  If 7PM-7AM, please contact night-coverage [email or web address] Password [facility] 06/07/2015, 12:51 AM

## 2015-06-08 DIAGNOSIS — T783XXD Angioneurotic edema, subsequent encounter: Secondary | ICD-10-CM | POA: Diagnosis not present

## 2015-06-08 LAB — GLUCOSE, CAPILLARY: Glucose-Capillary: 125 mg/dL — ABNORMAL HIGH (ref 65–99)

## 2015-06-08 MED ORDER — LORATADINE 10 MG PO TABS: 10.0000 mg | ORAL_TABLET | Freq: Every day | ORAL | Status: DC

## 2015-06-08 MED ORDER — PREDNISONE 10 MG PO TABS: ORAL_TABLET | ORAL | Status: DC

## 2015-06-08 NOTE — Progress Notes (Signed)
Pt is a tranfer from 2c to 2w08, alert and oriented x4, pt oriented to the unit and room, VSS, telemetry applied and verified; pt currently denies any pain, skin intact with no pressure ulcer or wounds noted. Pt resting comfortably in bed with call light within reach. Will continue to monitor pt.

## 2015-06-08 NOTE — Discharge Summary (Signed)
DISCHARGE SUMMARY  Theresa Mann  MR#: [id number]  DOB:1970/12/09  Date of Admission: 06/06/2015 Date of Discharge: 06/08/2015  Attending Physician:MCCLUNG,JEFFREY T  Patient's SQ:[id number], Provider, MD  Consults:  none  Disposition: D/C home   Follow-up Appts:     Follow-up Information    Follow up with Hennie Duos, MD. Schedule an appointment as soon as possible for a visit in 1 week.   Specialty:  Rheumatology   Contact information:   [address] [place] [address] [phone]       Follow up with Your Primary Care Provider  In 1 month.   Why:  To discuss your murmer and consider scheduline an outpt echocardiogram (US of the heart)      Tests Needing Follow-up: -I have informed the patient she would benefit from an outpt TTE (Echo) to evaluate her cardiac murmer  Discharge Diagnoses: Angioedema  Hypothyroidism  Asymptomatic Systolic Cardiac Murmer   Initial presentation: 45 y.o. female with PMH of hypothyroidism and recurrent idiopathic angioedema who presented to the ED with swelling of the right hand, feet, and lower lip. Patient reported this is the sixth time she's had these symptoms. She reported waking with bilateral feet swelling, noting swelling of the right hand at approximately 2 PM, and then swelling of the lower lip at approximately 5 PM. She went to urgent care for evaluation and was treated with a steroid and Benadryl. Upon returning home, her lip swelling continued to worsen and she presented to the emergency department. She reports undergoing extensive outpatient workup with an allergist which ultimately was unrevealing. She has a positive family history of idiopathic angioedema in 2 relatives. She does not take antihistamines scheduled at home, but will use Benadryl as needed for swelling. She reports history of tongue and throat swelling, but has never required endotracheal intubation. Her symptoms typically respond  quickly to steroid and antihistamines, but today is the first time that they have not.  In ED, patient was found to be afebrile, saturating well on room air, with vital signs stable. She was treated with a 1 L bolus of normal saline, 10 mg IV push of Decadron, IV Benadryl 2, and 20 mg IV Pepcid. Despite these measures, there was no appreciable improvement in her symptoms. She never did develop tongue or airway swelling and her respirations remained unlabored.   Hospital Course:  Angioedema  - most c/w recurrent histamine mediated angioedema  - Reports this is her 6th episode; she has 2 relatives with same  - Reports outpt w/u through allergist was negative for both allergy and hereditary angioedema  - There is no tongue swelling or airway compromise - has rapidly responded to decadron, benadryl, and pepcid  - recommend taking scheduled anti-histamines as outpt  - rapid steroid taper   Hypothyroidism  - Appears to be stable  - Continue home-dose Synthroid  Asymptomatic Systolic Cardiac Murmer  - noted on exam prior to d/c - pt states she has been told she has a M before - denies associated sx - I have recommended a routine outpt TTE to evaluate     Medication List    STOP taking these medications        famotidine 20 MG tablet  Commonly known as:  PEPCID      TAKE these medications        acetaminophen 325 MG tablet  Commonly known as:  TYLENOL  Take 650 mg by mouth every 6 (six) hours as  needed. For pain     diphenhydramine-acetaminophen 25-500 MG Tabs tablet  Commonly known as:  TYLENOL PM  Take 1 tablet by mouth at bedtime as needed.     levothyroxine 300 MCG tablet  Commonly known as:  SYNTHROID, LEVOTHROID  Take 300 mcg by mouth daily.     loratadine 10 MG tablet  Commonly known as:  CLARITIN  Take 1 tablet (10 mg total) by mouth daily.     predniSONE 10 MG tablet  Commonly known as:  DELTASONE  Take 4 tablets a day on 4/2 and 4/3, then 2 tablets a day  on 4/4 and 4/5, then 1 tablet a day on 4/6 and 4/7 then stop        Day of Discharge BP 121/73 mmHg  Pulse 63  Temp(Src) 98.2 F (36.8 C) (Oral)  Resp 18  Ht 5\' 8"  (1.727 m)  Wt 109.997 kg (242 lb 8 oz)  BMI 36.88 kg/m2  SpO2 99%  Physical Exam: General: No acute respiratory distress - no facial swelling  Lungs: Clear to auscultation bilaterally without wheezes or crackles Cardiovascular: Regular rate and rhythm w/ 2/6 systolic M w/o gallup or rub  Abdomen: Nontender, nondistended, soft, bowel sounds positive, no rebound, no ascites, no appreciable mass Extremities: No significant cyanosis, clubbing, or edema bilateral lower extremities  Basic Metabolic Panel:  Recent Labs Lab 06/07/15 0026  NA 138  K 3.9  CL 105  CO2 25  GLUCOSE 130*  BUN 7  CREATININE 0.83  CALCIUM 9.0   CBC:  Recent Labs Lab 06/07/15 0026  WBC 11.0*  NEUTROABS 10.2*  HGB 10.1*  HCT 31.7*  MCV 75.8*  PLT 292    CBG:  Recent Labs Lab 06/07/15 0845 06/08/15 0627  GLUCAP 128* 125*    Recent Results (from the past 240 hour(s))  MRSA PCR Screening     Status: None   Collection Time: 06/07/15  2:23 AM  Result Value Ref Range Status   MRSA by PCR NEGATIVE NEGATIVE Final    Comment:        The GeneXpert MRSA Assay (FDA approved for NASAL specimens only), is one component of a comprehensive MRSA colonization surveillance program. It is not intended to diagnose MRSA infection nor to guide or monitor treatment for MRSA infections.     Time spent in discharge (includes decision making & examination of pt): 30 minutes  06/08/2015, 9:12 AM   Cherene Altes, MD [facility] Office  [phone] Pager [phone]  On-Call/Text Page:      [email or web address]      password [facility]

## 2015-06-08 NOTE — Discharge Instructions (Signed)
Angioedema  Angioedema is a sudden swelling of tissues, often of the skin. It can occur on the face or genitals or in the abdomen or other body parts. The swelling usually develops over a short period and gets better in 24 to 48 hours. It often begins during the night and is found when the person wakes up. The person may also get red, itchy patches of skin (hives). Angioedema can be dangerous if it involves swelling of the air passages.   Depending on the cause, episodes of angioedema may only happen once, come back in unpredictable patterns, or repeat for several years and then gradually fade away.   CAUSES   Angioedema can be caused by an allergic reaction to various triggers. It can also result from nonallergic causes, including reactions to drugs, immune system disorders, viral infections, or an abnormal gene that is passed to you from your parents (hereditary). For some people with angioedema, the cause is unknown.   Some things that can trigger angioedema include:    Foods.    Medicines, such as ACE inhibitors, ARBs, nonsteroidal anti-inflammatory agents, or estrogen.    Latex.    Animal saliva.    Insect stings.    Dyes used in X-rays.    Mild injury.    Dental work.   Surgery.   Stress.    Sudden changes in temperature.    Exercise.  SIGNS AND SYMPTOMS    Swelling of the skin.   Hives. If these are present, there is also intense itching.   Redness in the affected area.    Pain in the affected area.   Swollen lips or tongue.   Breathing problems. This may happen if the air passages swell.   Wheezing.  If internal organs are involved, there may be:    Nausea.    Abdominal pain.    Vomiting.    Difficulty swallowing.    Difficulty passing urine.  DIAGNOSIS    Your health care provider will examine the affected area and take a medical and family history.   Various tests may be done to help determine the cause. Tests may include:   Allergy skin tests to see if the problem  is an allergic reaction.    Blood tests to check for hereditary angioedema.    Tests to check for underlying diseases that could cause the condition.    A review of your medicines, including over-the-counter medicines, may be done.  TREATMENT   Treatment will depend on the cause of the angioedema. Possible treatments include:    Removal of anything that triggered the condition (such as stopping certain medicines).    Medicines to treat symptoms or prevent attacks. Medicines given may include:     Antihistamines.     Epinephrine injection.     Steroids.    Hospitalization may be required for severe attacks. If the air passages are affected, it can be an emergency. Tubes may need to be placed to keep the airway open.  HOME CARE INSTRUCTIONS    Take all medicines as directed by your health care provider.   If you were given medicines for emergency allergy treatment, always carry them with you.   Wear a medical bracelet as directed by your health care provider.    Avoid known triggers.  SEEK MEDICAL CARE IF:    You have repeat attacks of angioedema.    Your attacks are more frequent or more severe despite preventive measures.    You have hereditary angioedema   and are considering having children. It is important to discuss with your health care provider the risks of passing the condition on to your children.  SEEK IMMEDIATE MEDICAL CARE IF:    You have severe swelling of the mouth, tongue, or lips.   You have difficulty breathing.    You have difficulty swallowing.    You faint.  MAKE SURE YOU:   Understand these instructions.   Will watch your condition.   Will get help right away if you are not doing well or get worse.     This information is not intended to replace advice given to you by your health care provider. Make sure you discuss any questions you have with your health care provider.     Document Released: 05/04/2001 Document Revised: 03/16/2014 Document Reviewed:  10/17/2012  Elsevier Interactive Patient Education 2016 [employer]

## 2015-10-25 ENCOUNTER — Encounter (HOSPITAL_COMMUNITY): Payer: Self-pay | Admitting: *Deleted

## 2015-10-28 ENCOUNTER — Other Ambulatory Visit: Payer: Self-pay | Admitting: Obstetrics and Gynecology

## 2015-11-08 ENCOUNTER — Ambulatory Visit (HOSPITAL_COMMUNITY): Payer: 59 | Admitting: Anesthesiology

## 2015-11-08 ENCOUNTER — Ambulatory Visit (HOSPITAL_COMMUNITY)
Admission: RE | Admit: 2015-11-08 | Discharge: 2015-11-08 | Disposition: A | Discharge status: Home / Self Care | Payer: 59 | Source: Ambulatory Visit | Attending: Obstetrics and Gynecology | Admitting: Obstetrics and Gynecology

## 2015-11-08 ENCOUNTER — Encounter (HOSPITAL_COMMUNITY): Payer: Self-pay

## 2015-11-08 ENCOUNTER — Encounter (HOSPITAL_COMMUNITY)
Admission: RE | Disposition: A | Discharge status: Home / Self Care | Payer: Self-pay | Source: Ambulatory Visit | Attending: Obstetrics and Gynecology

## 2015-11-08 DIAGNOSIS — N921 Excessive and frequent menstruation with irregular cycle: Secondary | ICD-10-CM | POA: Diagnosis not present

## 2015-11-08 DIAGNOSIS — D250 Submucous leiomyoma of uterus: Secondary | ICD-10-CM | POA: Diagnosis not present

## 2015-11-08 DIAGNOSIS — N920 Excessive and frequent menstruation with regular cycle: Secondary | ICD-10-CM

## 2015-11-08 DIAGNOSIS — N840 Polyp of corpus uteri: Secondary | ICD-10-CM | POA: Insufficient documentation

## 2015-11-08 DIAGNOSIS — E039 Hypothyroidism, unspecified: Secondary | ICD-10-CM | POA: Insufficient documentation

## 2015-11-08 HISTORY — PX: DILATATION & CURETTAGE/HYSTEROSCOPY WITH MYOSURE: SHX6511

## 2015-11-08 HISTORY — DX: Hypothyroidism, unspecified: E03.9

## 2015-11-08 HISTORY — DX: Anemia, unspecified: D64.9

## 2015-11-08 HISTORY — DX: Unspecified asthma, uncomplicated: J45.909

## 2015-11-08 LAB — CBC
HCT: 32.8 % — ABNORMAL LOW (ref 36.0–46.0)
Hemoglobin: 10.6 g/dL — ABNORMAL LOW (ref 12.0–15.0)
MCH: 26.6 pg (ref 26.0–34.0)
MCHC: 32.3 g/dL (ref 30.0–36.0)
MCV: 82.2 fL (ref 78.0–100.0)
Platelets: 269 10*3/uL (ref 150–400)
RBC: 3.99 MIL/uL (ref 3.87–5.11)
RDW: 14.7 % (ref 11.5–15.5)
WBC: 7.6 10*3/uL (ref 4.0–10.5)

## 2015-11-08 SURGERY — DILATATION & CURETTAGE/HYSTEROSCOPY WITH MYOSURE
Anesthesia: General

## 2015-11-08 MED ORDER — DIPHENHYDRAMINE HCL 50 MG/ML IJ SOLN
INTRAMUSCULAR | Status: DC | PRN
  Administered 2015-11-08: 25 mg via INTRAVENOUS

## 2015-11-08 MED ORDER — LIDOCAINE HCL (CARDIAC) 20 MG/ML IV SOLN
INTRAVENOUS | Status: DC | PRN
  Administered 2015-11-08: 50 mg via INTRAVENOUS

## 2015-11-08 MED ORDER — SCOPOLAMINE 1 MG/3DAYS TD PT72
1.0000 | MEDICATED_PATCH | Freq: Once | TRANSDERMAL | Status: DC
  Administered 2015-11-08: 1.5 mg via TRANSDERMAL

## 2015-11-08 MED ORDER — KETOROLAC TROMETHAMINE 30 MG/ML IJ SOLN
INTRAMUSCULAR | Status: AC
  Filled 2015-11-08: qty 2

## 2015-11-08 MED ORDER — LIDOCAINE HCL (CARDIAC) 20 MG/ML IV SOLN
INTRAVENOUS | Status: AC
  Filled 2015-11-08: qty 5

## 2015-11-08 MED ORDER — SCOPOLAMINE 1 MG/3DAYS TD PT72
MEDICATED_PATCH | TRANSDERMAL | Status: AC
  Administered 2015-11-08: 1.5 mg via TRANSDERMAL
  Filled 2015-11-08: qty 1

## 2015-11-08 MED ORDER — KETOROLAC TROMETHAMINE 30 MG/ML IJ SOLN
INTRAMUSCULAR | Status: DC | PRN
  Administered 2015-11-08: 30 mg via INTRAVENOUS
  Administered 2015-11-08: 30 mg via INTRAMUSCULAR

## 2015-11-08 MED ORDER — PROPOFOL 10 MG/ML IV BOLUS
INTRAVENOUS | Status: AC
  Filled 2015-11-08: qty 40

## 2015-11-08 MED ORDER — FENTANYL CITRATE (PF) 100 MCG/2ML IJ SOLN
INTRAMUSCULAR | Status: AC
  Filled 2015-11-08: qty 2

## 2015-11-08 MED ORDER — DIPHENHYDRAMINE HCL 50 MG/ML IJ SOLN
INTRAMUSCULAR | Status: AC
  Filled 2015-11-08: qty 1

## 2015-11-08 MED ORDER — IBUPROFEN 800 MG PO TABS: 800.0000 mg | ORAL_TABLET | Freq: Three times a day (TID) | ORAL | 4 refills | Status: DC | PRN

## 2015-11-08 MED ORDER — MIDAZOLAM HCL 2 MG/2ML IJ SOLN
INTRAMUSCULAR | Status: AC
  Filled 2015-11-08: qty 2

## 2015-11-08 MED ORDER — SODIUM CHLORIDE 0.9 % IR SOLN
Status: DC | PRN
  Administered 2015-11-08: 6000 mL

## 2015-11-08 MED ORDER — MIDAZOLAM HCL 2 MG/2ML IJ SOLN
INTRAMUSCULAR | Status: DC | PRN
Start: 2015-11-08 — End: 2015-11-08
  Administered 2015-11-08: 2 mg via INTRAVENOUS

## 2015-11-08 MED ORDER — FENTANYL CITRATE (PF) 100 MCG/2ML IJ SOLN
INTRAMUSCULAR | Status: DC | PRN
  Administered 2015-11-08: 100 ug via INTRAVENOUS

## 2015-11-08 MED ORDER — PROPOFOL 10 MG/ML IV BOLUS
INTRAVENOUS | Status: DC | PRN
  Administered 2015-11-08: 180 mg via INTRAVENOUS
  Administered 2015-11-08: 20 mg via INTRAVENOUS

## 2015-11-08 MED ORDER — ONDANSETRON HCL 4 MG/2ML IJ SOLN
INTRAMUSCULAR | Status: AC
Start: 2015-11-08 — End: 2015-11-08
  Filled 2015-11-08: qty 2

## 2015-11-08 MED ORDER — DEXAMETHASONE SODIUM PHOSPHATE 10 MG/ML IJ SOLN
INTRAMUSCULAR | Status: DC | PRN
  Administered 2015-11-08: 4 mg via INTRAVENOUS

## 2015-11-08 MED ORDER — ONDANSETRON HCL 4 MG/2ML IJ SOLN
INTRAMUSCULAR | Status: DC | PRN
  Administered 2015-11-08: 4 mg via INTRAVENOUS

## 2015-11-08 MED ORDER — LACTATED RINGERS IV SOLN
INTRAVENOUS | Status: DC
  Administered 2015-11-08: 125 mL/h via INTRAVENOUS
  Administered 2015-11-08: 16:00:00 via INTRAVENOUS

## 2015-11-08 MED ORDER — DEXAMETHASONE SODIUM PHOSPHATE 4 MG/ML IJ SOLN
INTRAMUSCULAR | Status: AC
  Filled 2015-11-08: qty 1

## 2015-11-08 SURGICAL SUPPLY — 20 items
CANISTER SUCT 3000ML (MISCELLANEOUS) ×2 IMPLANT
CATH ROBINSON RED A/P 16FR (CATHETERS) ×2 IMPLANT
CLOTH BEACON ORANGE TIMEOUT ST (SAFETY) ×2 IMPLANT
CONTAINER PREFILL 10% NBF 60ML (FORM) ×4 IMPLANT
DEVICE MYOSURE LITE (MISCELLANEOUS) IMPLANT
DEVICE MYOSURE REACH (MISCELLANEOUS) IMPLANT
ELECT REM PT RETURN 9FT ADLT (ELECTROSURGICAL) ×2
ELECTRODE REM PT RTRN 9FT ADLT (ELECTROSURGICAL) ×1 IMPLANT
FILTER ARTHROSCOPY CONVERTOR (FILTER) ×2 IMPLANT
GLOVE BIOGEL PI IND STRL 7.0 (GLOVE) ×2 IMPLANT
GLOVE BIOGEL PI INDICATOR 7.0 (GLOVE) ×2
GLOVE ECLIPSE 6.5 STRL STRAW (GLOVE) ×2 IMPLANT
GOWN STRL REUS W/TWL LRG LVL3 (GOWN DISPOSABLE) ×4 IMPLANT
PACK VAGINAL MINOR WOMEN LF (CUSTOM PROCEDURE TRAY) ×2 IMPLANT
PAD OB MATERNITY 4.3X12.25 (PERSONAL CARE ITEMS) ×2 IMPLANT
SEAL ROD LENS SCOPE MYOSURE (ABLATOR) ×2 IMPLANT
TOWEL OR 17X24 6PK STRL BLUE (TOWEL DISPOSABLE) ×4 IMPLANT
TUBING AQUILEX INFLOW (TUBING) ×2 IMPLANT
TUBING AQUILEX OUTFLOW (TUBING) ×2 IMPLANT
WATER STERILE IRR 1000ML POUR (IV SOLUTION) ×2 IMPLANT

## 2015-11-08 NOTE — Anesthesia Preprocedure Evaluation (Signed)
Anesthesia Evaluation  Patient identified by MRN, date of birth, ID band Patient awake    Reviewed: Allergy & Precautions, NPO status , Patient's Chart, lab work & pertinent test results  Airway Mallampati: II  TM Distance: >3 FB Neck ROM: Full    Dental no notable dental hx.    Pulmonary asthma ,    Pulmonary exam normal breath sounds clear to auscultation       Cardiovascular negative cardio ROS Normal cardiovascular exam Rhythm:Regular Rate:Normal     Neuro/Psych negative neurological ROS  negative psych ROS   GI/Hepatic negative GI ROS, Neg liver ROS,   Endo/Other  Hypothyroidism   Renal/GU negative Renal ROS  negative genitourinary   Musculoskeletal negative musculoskeletal ROS (+)   Abdominal   Peds negative pediatric ROS (+)  Hematology  (+) anemia ,   Anesthesia Other Findings   Reproductive/Obstetrics negative OB ROS                             Anesthesia Physical Anesthesia Plan  ASA: II  Anesthesia Plan: General   Post-op Pain Management:    Induction: Intravenous  Airway Management Planned: LMA  Additional Equipment:   Intra-op Plan:   Post-operative Plan: Extubation in OR  Informed Consent: I have reviewed the patients History and Physical, chart, labs and discussed the procedure including the risks, benefits and alternatives for the proposed anesthesia with the patient or authorized representative who has indicated his/her understanding and acceptance.   Dental advisory given  Plan Discussed with: CRNA  Anesthesia Plan Comments:         Anesthesia Quick Evaluation

## 2015-11-08 NOTE — Discharge Instructions (Signed)
Post Anesthesia Home Care Instructions  Activity: Get plenty of rest for the remainder of the day. A responsible adult should stay with you for 24 hours following the procedure.  For the next 24 hours, DO NOT: -Drive a car -Paediatric nurse -Drink alcoholic beverages -Take any medication unless instructed by your physician -Make any legal decisions or sign important papers.  Meals: Start with liquid foods such as gelatin or soup. Progress to regular foods as tolerated. Avoid greasy, spicy, heavy foods. If nausea and/or vomiting occur, drink only clear liquids until the nausea and/or vomiting subsides. Call your physician if vomiting continues.  Special Instructions/Symptoms: Your throat may feel dry or sore from the anesthesia or the breathing tube placed in your throat during surgery. If this causes discomfort, gargle with warm salt water. The discomfort should disappear within 24 hours.  If you had a scopolamine patch placed behind your ear for the management of post- operative nausea and/or vomiting:  1. The medication in the patch is effective for 72 hours, after which it should be removed.  Wrap patch in a tissue and discard in the trash. Wash hands thoroughly with soap and water. 2. You may remove the patch earlier than 72 hours if you experience unpleasant side effects which may include dry mouth, dizziness or visual disturbances. 3. Avoid touching the patch. Wash your hands with soap and water after contact with the patch.   Hysteroscopy, Care After Refer to this sheet in the next few weeks. These instructions provide you with information on caring for yourself after your procedure. Your health care provider may also give you more specific instructions. Your treatment has been planned according to current medical practices, but problems sometimes occur. Call your health care provider if you have any problems or questions after your procedure.  WHAT TO EXPECT AFTER THE  PROCEDURE After your procedure, it is typical to have the following:  You may have some cramping. This normally lasts for a couple days.  You may have bleeding. This can vary from light spotting for a few days to menstrual-like bleeding for 3-7 days. HOME CARE INSTRUCTIONS  Rest for the first 1-2 days after the procedure.  Only take over-the-counter or prescription medicines as directed by your health care provider. Do not take aspirin. It can increase the chances of bleeding.  Take showers instead of baths for 2 weeks or as directed by your health care provider.  Do not drive for 24 hours or as directed.  Do not drink alcohol while taking pain medicine.  Do not use tampons, douche, or have sexual intercourse for 2 weeks or until your health care provider says it is okay.  Take your temperature twice a day for 4-5 days. Write it down each time.  Follow your health care provider's advice about diet, exercise, and lifting.  If you develop constipation, you may:  Take a mild laxative if your health care provider approves.  Add bran foods to your diet.  Drink enough fluids to keep your urine clear or pale yellow.  Try to have someone with you or available to you for the first 24-48 hours, especially if you were given a general anesthetic.  Follow up with your health care provider as directed. SEEK MEDICAL CARE IF:  You feel dizzy or lightheaded.  You feel sick to your stomach (nauseous).  You have abnormal vaginal discharge.  You have a rash.  You have pain that is not controlled with medicine. SEEK IMMEDIATE MEDICAL CARE IF:  You have bleeding that is heavier than a normal menstrual period.  You have a fever.  You have increasing cramps or pain, not controlled with medicine.  You have new belly (abdominal) pain.  You pass out.  You have pain in the tops of your shoulders (shoulder strap areas).  You have shortness of breath.   This information is not  intended to replace advice given to you by your health care provider. Make sure you discuss any questions you have with your health care provider.   Document Released: 12/14/2012 Document Reviewed: 12/14/2012 Elsevier Interactive Patient Education [employer].

## 2015-11-08 NOTE — Anesthesia Procedure Notes (Signed)
Procedure Name: LMA Insertion Date/Time: 11/08/2015 2:58 PM Performed by: Casimer Lanius A Pre-anesthesia Checklist: Patient being monitored, Patient identified, Emergency Drugs available and Suction available Patient Re-evaluated:Patient Re-evaluated prior to inductionOxygen Delivery Method: Circle system utilized Preoxygenation: Pre-oxygenation with 100% oxygen Intubation Type: IV induction and Inhalational induction Ventilation: Mask ventilation without difficulty LMA: LMA inserted LMA Size: 4.0 Number of attempts: 1 Dental Injury: Teeth and Oropharynx as per pre-operative assessment

## 2015-11-08 NOTE — Transfer of Care (Signed)
Immediate Anesthesia Transfer of Care Note  Patient: Theresa Mann  Procedure(s) Performed: Procedure(s): DILATATION & CURETTAGE/HYSTEROSCOPY WITH MYOSURE (N/A)  Patient Location: PACU  Anesthesia Type:General  Level of Consciousness: sedated  Airway & Oxygen Therapy: Patient Spontanous Breathing and Patient connected to nasal cannula oxygen  Post-op Assessment: Report given to RN  Post vital signs: Reviewed and stable  Last Vitals:  Vitals:   11/08/15 1301  BP: (!) 144/100  Pulse: 75  Resp: 16  Temp: 36.8 C    Last Pain:  Vitals:   11/08/15 1301  TempSrc: Oral      Patients Stated Pain Goal: 5 ([id number])  Complications: No apparent anesthesia complications

## 2015-11-08 NOTE — Brief Op Note (Signed)
11/08/2015  3:54 PM  PATIENT:  Theresa Mann  45 y.o. female  PRE-OPERATIVE DIAGNOSIS:  Endometrial Mass, Menometrorrhagia  POST-OPERATIVE DIAGNOSIS:  Endometrial polyp, submucosal fibroid, Menometrorrhagia  PROCEDURE:  Diagnostic hysteroscopy, dilation and curettage, hysteroscopic resection of endometrial polyp and Submucosal fibroid using myosure  SURGEON:  Surgeon(s) and Role:    * Leanndra Pember, MD - Primary  PHYSICIAN ASSISTANT:   ASSISTANTS: none   ANESTHESIA:   general FINDINGS:  Right lateral large endometrial polyp, left posterior pea size submucosal fibroid, tubal ostia seen, nl endocervical canal EBL:  Total I/O In: 1000 [I.V.:1000] Out: 105 [Urine:100; Blood:5]  BLOOD ADMINISTERED:none  DRAINS: none   LOCAL MEDICATIONS USED:  NONE  SPECIMEN:  Source of Specimen:  EMC with endometrial polyp, SM fibroid resection  DISPOSITION OF SPECIMEN:  PATHOLOGY  COUNTS:  YES  TOURNIQUET:  * No tourniquets in log *  DICTATION: .Other Dictation: Dictation Number [id number]  PLAN OF CARE: Discharge to home after PACU  PATIENT DISPOSITION:  PACU - hemodynamically stable.   Delay start of Pharmacological VTE agent (>24hrs) due to surgical blood loss or risk of bleeding: no

## 2015-11-08 NOTE — Anesthesia Postprocedure Evaluation (Signed)
Anesthesia Post Note  Patient: Theresa Mann  Procedure(s) Performed: Procedure(s) (LRB): DILATATION & CURETTAGE/HYSTEROSCOPY WITH MYOSURE (N/A)  Patient location during evaluation: PACU Anesthesia Type: General Level of consciousness: awake and alert Pain management: pain level controlled Vital Signs Assessment: post-procedure vital signs reviewed and stable Respiratory status: spontaneous breathing, nonlabored ventilation, respiratory function stable and patient connected to nasal cannula oxygen Cardiovascular status: blood pressure returned to baseline and stable Postop Assessment: no signs of nausea or vomiting Anesthetic complications: no     Last Vitals:  Vitals:   11/08/15 1645 11/08/15 1700  BP: (!) 141/96 (!) 143/89  Pulse: 68 78  Resp: 13 16  Temp:  37.1 C    Last Pain:  Vitals:   11/08/15 1301  TempSrc: Oral   Pain Goal: Patients Stated Pain Goal: 5 (11/08/15 1550)               Hoda Hon J

## 2015-11-09 NOTE — Op Note (Signed)
[name], Theresa Mann             ACCOUNT NO.:  [id number]  MEDICAL RECORD NO.:  [id number]  LOCATION:  [facility]                          FACILITY:  [facility]  PHYSICIAN:  Servando Salina, M.D.DATE OF BIRTH:  1970-11-17  DATE OF PROCEDURE:  11/08/2015 DATE OF DISCHARGE:  11/08/2015                              OPERATIVE REPORT   PREOPERATIVE DIAGNOSIS:  Menorrhagia with regular cycles.  Endometrial mass.  PROCEDURES:  Diagnostic hysteroscopy, hysteroscopic resection of submucosal fibroid, and endometrial polyp using the MyoSure, dilation and curettage.  POSTOPERATIVE DIAGNOSIS:  Submucosal fibroid, endometrial polyp, menorrhagia with regular cycles.  ANESTHESIA:  General.  SURGEON:  Servando Salina, MD.  ASSISTANT:  None.  DESCRIPTION OF PROCEDURE:  Under adequate general anesthesia, the patient was placed in the dorsal lithotomy position.  She was sterilely prepped and draped in usual fashion.  The bladder was catheterized for moderate amount of urine.  Examination under anesthesia revealed anteverted uterus.  No adnexal masses could be appreciated.  Bivalve speculum was placed in the vagina.  Single-tooth tenaculum was placed on the anterior lip of the cervix.  The cervix was parous and easily accepted a #23 Presenter, broadcasting.  The MyoSure hysteroscope was introduced into the uterine cavity.  Both tubal ostia could be seen. On retracting from the fundal region towards the endocervical canal,it was then noted there was a large polypoid mass extending into the lower uterine Segment which arose from the right lateral wall.  The Reach resectoscope was then utilized through that MyoSure apparatus with resection of the large endometrial polyp.  The endometrium was then resected using the Myosure.  A pea size left lower uterine segment submucosal fibroid was noted and was also resected. The cavity was inspected.  No other lesions were noted.  Endocervical canal was without any  lesions.  During the case, large egress of the fluid resulted in the tenaculum, which had initially been placed on the anterior lip to be placed vertically on the anterior and posterior lip of the cervix to create a better seal of the uterine cavity thus facilitating the surgery.  The resectoscope was removed.  The tenaculum which was in a vertical fashion was then put back in the regular horizontal manner, and the uterus were then gently curetted.  After hemostasis was noted at the tenaculum sites, all instruments were then removed from the vagina.  SPECIMEN:  Labeled endometrial curetting with submucosal fibroid resection and polyp were sent to Pathology.  ESTIMATED BLOOD LOSS:  Minimal.  COMPLICATION:  None.  FLUID DEFICIT:  600 mL.  COUNTS:  Sponge and instrument counts x2 was correct.  COMPLICATION:  None.  The patient tolerated the procedure well, was transferred to recovery room in stable condition.     Servando Salina, M.D.     [place]/[facility]  D:  11/08/2015  T:  11/09/2015  Job:  [id number]

## 2015-11-10 ENCOUNTER — Encounter (HOSPITAL_COMMUNITY): Payer: Self-pay | Admitting: Obstetrics and Gynecology

## 2017-06-21 ENCOUNTER — Encounter (HOSPITAL_COMMUNITY): Payer: Self-pay | Admitting: *Deleted

## 2017-06-21 ENCOUNTER — Other Ambulatory Visit: Payer: Self-pay

## 2017-06-29 ENCOUNTER — Other Ambulatory Visit: Payer: Self-pay | Admitting: Obstetrics and Gynecology

## 2017-07-09 ENCOUNTER — Ambulatory Visit (HOSPITAL_COMMUNITY)
Admission: RE | Admit: 2017-07-09 | Discharge: 2017-07-09 | Disposition: A | Discharge status: Home / Self Care | Payer: Managed Care, Other (non HMO) | Source: Ambulatory Visit | Attending: Obstetrics and Gynecology | Admitting: Obstetrics and Gynecology

## 2017-07-09 ENCOUNTER — Encounter (HOSPITAL_COMMUNITY): Payer: Self-pay | Admitting: Anesthesiology

## 2017-07-09 ENCOUNTER — Ambulatory Visit (HOSPITAL_COMMUNITY): Payer: Managed Care, Other (non HMO) | Admitting: Anesthesiology

## 2017-07-09 ENCOUNTER — Other Ambulatory Visit: Payer: Self-pay

## 2017-07-09 ENCOUNTER — Encounter (HOSPITAL_COMMUNITY)
Admission: RE | Disposition: A | Discharge status: Home / Self Care | Payer: Self-pay | Source: Ambulatory Visit | Attending: Obstetrics and Gynecology

## 2017-07-09 DIAGNOSIS — R011 Cardiac murmur, unspecified: Secondary | ICD-10-CM | POA: Insufficient documentation

## 2017-07-09 DIAGNOSIS — Z888 Allergy status to other drugs, medicaments and biological substances status: Secondary | ICD-10-CM | POA: Diagnosis not present

## 2017-07-09 DIAGNOSIS — N920 Excessive and frequent menstruation with regular cycle: Secondary | ICD-10-CM | POA: Diagnosis present

## 2017-07-09 DIAGNOSIS — N840 Polyp of corpus uteri: Secondary | ICD-10-CM | POA: Insufficient documentation

## 2017-07-09 DIAGNOSIS — Z6834 Body mass index (BMI) 34.0-34.9, adult: Secondary | ICD-10-CM | POA: Insufficient documentation

## 2017-07-09 DIAGNOSIS — Z91011 Allergy to milk products: Secondary | ICD-10-CM | POA: Insufficient documentation

## 2017-07-09 DIAGNOSIS — J45909 Unspecified asthma, uncomplicated: Secondary | ICD-10-CM | POA: Diagnosis not present

## 2017-07-09 DIAGNOSIS — Z79899 Other long term (current) drug therapy: Secondary | ICD-10-CM | POA: Diagnosis not present

## 2017-07-09 DIAGNOSIS — Z880 Allergy status to penicillin: Secondary | ICD-10-CM | POA: Insufficient documentation

## 2017-07-09 DIAGNOSIS — E669 Obesity, unspecified: Secondary | ICD-10-CM | POA: Diagnosis not present

## 2017-07-09 DIAGNOSIS — E039 Hypothyroidism, unspecified: Secondary | ICD-10-CM | POA: Insufficient documentation

## 2017-07-09 HISTORY — DX: Edema, unspecified: R60.9

## 2017-07-09 HISTORY — PX: DILATATION & CURETTAGE/HYSTEROSCOPY WITH MYOSURE: SHX6511

## 2017-07-09 HISTORY — DX: Cardiac murmur, unspecified: R01.1

## 2017-07-09 HISTORY — PX: HYSTEROSCOPY WITH NOVASURE: SHX5574

## 2017-07-09 LAB — CBC
HCT: 36.7 % (ref 36.0–46.0)
Hemoglobin: 11.5 g/dL — ABNORMAL LOW (ref 12.0–15.0)
MCH: 24 pg — ABNORMAL LOW (ref 26.0–34.0)
MCHC: 31.3 g/dL (ref 30.0–36.0)
MCV: 76.5 fL — ABNORMAL LOW (ref 78.0–100.0)
Platelets: 323 10*3/uL (ref 150–400)
RBC: 4.8 MIL/uL (ref 3.87–5.11)
RDW: 20.5 % — ABNORMAL HIGH (ref 11.5–15.5)
WBC: 8.1 10*3/uL (ref 4.0–10.5)

## 2017-07-09 SURGERY — DILATATION & CURETTAGE/HYSTEROSCOPY WITH MYOSURE
Anesthesia: General

## 2017-07-09 MED ORDER — HYDROCODONE-ACETAMINOPHEN 7.5-325 MG PO TABS
1.0000 | ORAL_TABLET | Freq: Once | ORAL | Status: AC | PRN
  Administered 2017-07-09: 1 via ORAL

## 2017-07-09 MED ORDER — LIDOCAINE HCL (CARDIAC) PF 100 MG/5ML IV SOSY
PREFILLED_SYRINGE | INTRAVENOUS | Status: DC | PRN
  Administered 2017-07-09: 100 mg via INTRAVENOUS

## 2017-07-09 MED ORDER — DEXAMETHASONE SODIUM PHOSPHATE 4 MG/ML IJ SOLN
INTRAMUSCULAR | Status: AC
  Filled 2017-07-09: qty 1

## 2017-07-09 MED ORDER — MEPERIDINE HCL 25 MG/ML IJ SOLN: 6.2500 mg | INTRAMUSCULAR | Status: DC | PRN

## 2017-07-09 MED ORDER — KETOROLAC TROMETHAMINE 30 MG/ML IJ SOLN
INTRAMUSCULAR | Status: AC
  Filled 2017-07-09: qty 1

## 2017-07-09 MED ORDER — FENTANYL CITRATE (PF) 100 MCG/2ML IJ SOLN
INTRAMUSCULAR | Status: AC
  Filled 2017-07-09: qty 2

## 2017-07-09 MED ORDER — LACTATED RINGERS IV SOLN
INTRAVENOUS | Status: DC
  Administered 2017-07-09: 125 mL/h via INTRAVENOUS
  Administered 2017-07-09: 10:00:00 via INTRAVENOUS

## 2017-07-09 MED ORDER — MIDAZOLAM HCL 2 MG/2ML IJ SOLN
INTRAMUSCULAR | Status: DC | PRN
  Administered 2017-07-09: 2 mg via INTRAVENOUS

## 2017-07-09 MED ORDER — METOCLOPRAMIDE HCL 5 MG/ML IJ SOLN: 10.0000 mg | Freq: Once | INTRAMUSCULAR | Status: DC | PRN

## 2017-07-09 MED ORDER — SCOPOLAMINE 1 MG/3DAYS TD PT72
MEDICATED_PATCH | TRANSDERMAL | Status: AC
  Administered 2017-07-09: 1.5 mg via TRANSDERMAL
  Filled 2017-07-09: qty 1

## 2017-07-09 MED ORDER — HYDROCODONE-ACETAMINOPHEN 5-325 MG PO TABS
1.0000 | ORAL_TABLET | Freq: Four times a day (QID) | ORAL | 0 refills | Status: AC | PRN
Start: 2017-07-09 — End: 2017-07-14

## 2017-07-09 MED ORDER — DEXAMETHASONE SODIUM PHOSPHATE 10 MG/ML IJ SOLN
INTRAMUSCULAR | Status: DC | PRN
  Administered 2017-07-09: 4 mg via INTRAVENOUS

## 2017-07-09 MED ORDER — SCOPOLAMINE 1 MG/3DAYS TD PT72: 1.0000 | MEDICATED_PATCH | TRANSDERMAL | Status: DC

## 2017-07-09 MED ORDER — FENTANYL CITRATE (PF) 100 MCG/2ML IJ SOLN
INTRAMUSCULAR | Status: AC
  Administered 2017-07-09: 50 ug via INTRAVENOUS
  Filled 2017-07-09: qty 2

## 2017-07-09 MED ORDER — HYDROCODONE-ACETAMINOPHEN 7.5-325 MG PO TABS
ORAL_TABLET | ORAL | Status: AC
  Filled 2017-07-09: qty 1

## 2017-07-09 MED ORDER — ONDANSETRON HCL 4 MG/2ML IJ SOLN
INTRAMUSCULAR | Status: AC
  Filled 2017-07-09: qty 2

## 2017-07-09 MED ORDER — LIDOCAINE HCL (CARDIAC) PF 100 MG/5ML IV SOSY
PREFILLED_SYRINGE | INTRAVENOUS | Status: AC
  Filled 2017-07-09: qty 5

## 2017-07-09 MED ORDER — PROPOFOL 10 MG/ML IV BOLUS
INTRAVENOUS | Status: DC | PRN
  Administered 2017-07-09: 200 mg via INTRAVENOUS

## 2017-07-09 MED ORDER — FENTANYL CITRATE (PF) 100 MCG/2ML IJ SOLN
25.0000 ug | INTRAMUSCULAR | Status: DC | PRN
  Administered 2017-07-09 (×3): 50 ug via INTRAVENOUS

## 2017-07-09 MED ORDER — MIDAZOLAM HCL 2 MG/2ML IJ SOLN
INTRAMUSCULAR | Status: AC
  Filled 2017-07-09: qty 2

## 2017-07-09 MED ORDER — ONDANSETRON HCL 4 MG/2ML IJ SOLN
INTRAMUSCULAR | Status: DC | PRN
  Administered 2017-07-09: 4 mg via INTRAVENOUS

## 2017-07-09 MED ORDER — SCOPOLAMINE 1 MG/3DAYS TD PT72
1.0000 | MEDICATED_PATCH | Freq: Once | TRANSDERMAL | Status: DC
  Administered 2017-07-09: 1.5 mg via TRANSDERMAL

## 2017-07-09 MED ORDER — PROPOFOL 10 MG/ML IV BOLUS
INTRAVENOUS | Status: AC
Start: 2017-07-09 — End: ?
  Filled 2017-07-09: qty 20

## 2017-07-09 SURGICAL SUPPLY — 16 items
ABLATOR ENDOMETRIAL BIPOLAR (ABLATOR) ×2 IMPLANT
CANISTER SUCT 3000ML PPV (MISCELLANEOUS) ×2 IMPLANT
CATH ROBINSON RED A/P 16FR (CATHETERS) ×2 IMPLANT
DEVICE MYOSURE LITE (MISCELLANEOUS) IMPLANT
DEVICE MYOSURE REACH (MISCELLANEOUS) IMPLANT
FILTER ARTHROSCOPY CONVERTOR (FILTER) ×2 IMPLANT
GLOVE BIOGEL PI IND STRL 7.0 (GLOVE) ×3 IMPLANT
GLOVE BIOGEL PI INDICATOR 7.0 (GLOVE) ×3
GLOVE ECLIPSE 6.5 STRL STRAW (GLOVE) ×2 IMPLANT
GOWN STRL REUS W/TWL LRG LVL3 (GOWN DISPOSABLE) ×4 IMPLANT
PACK VAGINAL MINOR WOMEN LF (CUSTOM PROCEDURE TRAY) ×2 IMPLANT
PAD OB MATERNITY 4.3X12.25 (PERSONAL CARE ITEMS) ×2 IMPLANT
SEAL ROD LENS SCOPE MYOSURE (ABLATOR) ×2 IMPLANT
TOWEL OR 17X24 6PK STRL BLUE (TOWEL DISPOSABLE) ×4 IMPLANT
TUBING AQUILEX INFLOW (TUBING) ×2 IMPLANT
TUBING AQUILEX OUTFLOW (TUBING) ×2 IMPLANT

## 2017-07-09 NOTE — Anesthesia Preprocedure Evaluation (Signed)
Anesthesia Evaluation  Patient identified by MRN, date of birth, ID band Patient awake    Reviewed: Allergy & Precautions, NPO status , Patient's Chart, lab work & pertinent test results  Airway Mallampati: II  TM Distance: >3 FB Neck ROM: Full    Dental no notable dental hx. (+) Teeth Intact   Pulmonary asthma ,    Pulmonary exam normal breath sounds clear to auscultation       Cardiovascular negative cardio ROS Normal cardiovascular exam Rhythm:Regular Rate:Normal     Neuro/Psych negative neurological ROS  negative psych ROS   GI/Hepatic negative GI ROS, Neg liver ROS,   Endo/Other  Hypothyroidism Obesity   Renal/GU negative Renal ROS  negative genitourinary   Musculoskeletal   Abdominal (+) + obese,   Peds  Hematology  (+) anemia , Hx/o angioedema   Anesthesia Other Findings   Reproductive/Obstetrics Menorrhagia  Uterine fibroid                             Anesthesia Physical Anesthesia Plan  ASA: II  Anesthesia Plan: General   Post-op Pain Management:    Induction: Intravenous  PONV Risk Score and Plan: 4 or greater and Scopolamine patch - Pre-op, Midazolam, Dexamethasone, Ondansetron and Treatment may vary due to age or medical condition  Airway Management Planned: LMA  Additional Equipment:   Intra-op Plan:   Post-operative Plan: Extubation in OR  Informed Consent: I have reviewed the patients History and Physical, chart, labs and discussed the procedure including the risks, benefits and alternatives for the proposed anesthesia with the patient or authorized representative who has indicated his/her understanding and acceptance.   Dental advisory given  Plan Discussed with: Anesthesiologist, CRNA and Surgeon  Anesthesia Plan Comments:         Anesthesia Quick Evaluation

## 2017-07-09 NOTE — Discharge Instructions (Signed)
DISCHARGE INSTRUCTIONS: HYSTEROSCOPY / ENDOMETRIAL ABLATION °The following instructions have been prepared to help you care for yourself upon your return home. ° °May Remove Scop patch on or before ° °May take Ibuprofen after ° °May take stool softner while taking narcotic pain medication to prevent constipation.  Drink plenty of water. ° °Personal hygiene: °• Use sanitary pads for vaginal drainage, not tampons. °• Shower the day after your procedure. °• NO tub baths, pools or Jacuzzis for 2-3 weeks. °• Wipe front to back after using the bathroom. ° °Activity and limitations: °• Do NOT drive or operate any equipment for 24 hours. The effects of anesthesia are still present °and drowsiness may result. °• Do NOT rest in bed all day. °• Walking is encouraged. °• Walk up and down stairs slowly. °• You may resume your normal activity in one to two days or as indicated by your physician. °Sexual activity: NO intercourse for at least 2 weeks after the procedure, or as indicated by your °Doctor. ° °Diet: Eat a light meal as desired this evening. You may resume your usual diet tomorrow. ° °Return to Work: You may resume your work activities in one to two days or as indicated by your °Doctor. ° °What to expect after your surgery: Expect to have vaginal bleeding/discharge for 2-3 days and °spotting for up to 10 days. It is not unusual to have soreness for up to 1-2 weeks. You may have a °slight burning sensation when you urinate for the first day. Mild cramps may continue for a couple of °days. You may have a regular period in 2-6 weeks. ° °Call your doctor for any of the following: °• Excessive vaginal bleeding or clotting, saturating and changing one pad every hour. °• Inability to urinate 6 hours after discharge from hospital. °• Pain not relieved by pain medication. °• Fever of 100.4° F or greater. °• Unusual vaginal discharge or odor. ° °Return to office _________________Call for an appointment  ___________________ °Patient’s signature: ______________________ °Nurse’s signature ________________________ ° °Post Anesthesia Care Unit [phone] °Post Anesthesia Home Care Instructions ° °Activity: °Get plenty of rest for the remainder of the day. A responsible individual must stay with you for 24 hours following the procedure.  °For the next 24 hours, DO NOT: °-Drive a car °-Operate machinery °-Drink alcoholic beverages °-Take any medication unless instructed by your physician °-Make any legal decisions or sign important papers. ° °Meals: °Start with liquid foods such as gelatin or soup. Progress to regular foods as tolerated. Avoid greasy, spicy, heavy foods. If nausea and/or vomiting occur, drink only clear liquids until the nausea and/or vomiting subsides. Call your physician if vomiting continues. ° °Special Instructions/Symptoms: °Your throat may feel dry or sore from the anesthesia or the breathing tube placed in your throat during surgery. If this causes discomfort, gargle with warm salt water. The discomfort should disappear within 24 hours. ° °If you had a scopolamine patch placed behind your ear for the management of post- operative nausea and/or vomiting: ° °1. The medication in the patch is effective for 72 hours, after which it should be removed.  Wrap patch in a tissue and discard in the trash. Wash hands thoroughly with soap and water. °2. You may remove the patch earlier than 72 hours if you experience unpleasant side effects which may include dry mouth, dizziness or visual disturbances. °3. Avoid touching the patch. Wash your hands with soap and water after contact with the patch. °  ° °

## 2017-07-09 NOTE — Anesthesia Postprocedure Evaluation (Signed)
Anesthesia Post Note  Patient: Yarelis A Martis  Procedure(s) Performed: DILATATION & CURETTAGE/HYSTEROSCOPY (N/A ) HYSTEROSCOPY WITH NOVASURE (N/A )     Patient location during evaluation: PACU Anesthesia Type: General Level of consciousness: awake and alert and oriented Pain management: pain level controlled Vital Signs Assessment: post-procedure vital signs reviewed and stable Respiratory status: spontaneous breathing, nonlabored ventilation and respiratory function stable Cardiovascular status: blood pressure returned to baseline and stable Postop Assessment: no apparent nausea or vomiting Anesthetic complications: no    Last Vitals:  Vitals:   07/09/17 1230 07/09/17 1245  BP: (!) 139/94 140/89  Pulse: 66 65  Resp: 11 11  Temp:  36.8 C  SpO2: 96% 97%    Last Pain:  Vitals:   07/09/17 1245  TempSrc:   PainSc: 3    Pain Goal: Patients Stated Pain Goal: 5 (07/09/17 1245)               Aiken Withem A.

## 2017-07-09 NOTE — Brief Op Note (Signed)
07/09/2017  11:13 AM  PATIENT:  Theresa Mann  47 y.o. female  PRE-OPERATIVE DIAGNOSIS:  Menorrhagia, Uterine Fibroid  POST-OPERATIVE DIAGNOSIS:  Menorrhagia, endometrial polyp  PROCEDURE:  Diagnostic hysteroscopy, D&C, Novasure endometrial ablation  SURGEON:  Surgeon(s) and Role:    * Servando Salina, MD - Primary  PHYSICIAN ASSISTANT:   ASSISTANTS: none   ANESTHESIA:   general Findings: LUS anterior endometrial polyp EBL:  minimal   BLOOD ADMINISTERED:none  DRAINS: none   LOCAL MEDICATIONS USED:  NONE  SPECIMEN:  Source of Specimen:  endometrial polyp with curetting  DISPOSITION OF SPECIMEN:  PATHOLOGY  COUNTS:  YES  TOURNIQUET:  * No tourniquets in log *  DICTATION: .Other Dictation: Dictation Number [id number]  PLAN OF CARE: Discharge to home after PACU  PATIENT DISPOSITION:  PACU - hemodynamically stable.   Delay start of Pharmacological VTE agent (>24hrs) due to surgical blood loss or risk of bleeding: no

## 2017-07-09 NOTE — Transfer of Care (Signed)
Immediate Anesthesia Transfer of Care Note  Patient: Theresa Mann  Procedure(s) Performed: DILATATION & CURETTAGE/HYSTEROSCOPY (N/A ) HYSTEROSCOPY WITH NOVASURE (N/A )  Patient Location: PACU  Anesthesia Type:General  Level of Consciousness: awake, alert  and oriented  Airway & Oxygen Therapy: Patient Spontanous Breathing and Patient connected to nasal cannula oxygen  Post-op Assessment: Report given to RN, Post -op Vital signs reviewed and stable and Patient moving all extremities  Post vital signs: Reviewed and stable  Last Vitals:  Vitals Value Taken Time  BP    Temp    Pulse 39 07/09/2017 11:21 AM  Resp    SpO2 82 % 07/09/2017 11:21 AM  Vitals shown include unvalidated device data.  Last Pain:  Vitals:   07/09/17 0922  TempSrc: Oral      Patients Stated Pain Goal: 5 (18/56/31 4970)  Complications: No apparent anesthesia complications

## 2017-07-09 NOTE — H&P (Signed)
Theresa Mann is an 47 y.o. female. BF presents for surgical mgmt of menorrhagia with heavy cycles. EN+BX benign. (+) uterine fibroids  Pertinent Gynecological History: Menses: heavy Bleeding: menorrhagia Contraception: none DES exposure: denies Blood transfusions: none Sexually transmitted diseases: no past history Previous GYN Procedures:n/a Last mammogram: normal Date: 2019 Last pap: normal Date: 2019    Menstrual History: Menarche age: n/a Patient's last menstrual period was 06/09/2017.    Past Medical History:  Diagnosis Date  . Allergic   . Anemia   . Asthma    seasoanl - rarely uses inhaler  . Heart murmur   . Hypothyroidism   . SVD (spontaneous vaginal delivery)    x 2  . Swelling 2017   LIPS, ARMS AND HAND   . Thyroid disease     Past Surgical History:  Procedure Laterality Date  . BREAST SURGERY Right    benign cyst removed  . CHOLECYSTECTOMY    . DILATATION & CURETTAGE/HYSTEROSCOPY WITH MYOSURE N/A 11/08/2015   Procedure: DILATATION & CURETTAGE/HYSTEROSCOPY WITH MYOSURE;  Surgeon: Servando Salina, MD;  Location: [facility];  Service: Gynecology;  Laterality: N/A;  . TUBAL LIGATION      History reviewed. No pertinent family history.  Social History:  reports that she has never smoked. She has never used smokeless tobacco. She reports that she drinks about 1.2 oz of alcohol per week. She reports that she does not use drugs.  Allergies:  Allergies  Allergen Reactions  . Clindamycin/Lincomycin Anaphylaxis  . Penicillins Anaphylaxis    Has patient had a PCN reaction causing immediate rash, facial/tongue/throat swelling, SOB or lightheadedness with hypotension: Yes Has patient had a PCN reaction causing severe rash involving mucus membranes or skin necrosis: No Has patient had a PCN reaction that required hospitalization Yes Has patient had a PCN reaction occurring within the last 10 years: Yes If all of the above answers are "NO", then may proceed with  Cephalosporin use.  . Milk-Related Compounds     Throat scratchy  . Pollen Extract     Medications Prior to Admission  Medication Sig Dispense Refill Last Dose  . cyclobenzaprine (FLEXERIL) 5 MG tablet Take 5-10 mg by mouth 3 (three) times daily as needed for muscle spasms.   0 07/08/2017 at Unknown time  . ibuprofen (ADVIL,MOTRIN) 200 MG tablet Take 400 mg by mouth every 6 (six) hours as needed for mild pain.   07/08/2017 at Unknown time  . levothyroxine (SYNTHROID, LEVOTHROID) 150 MCG tablet Take 150 mcg by mouth daily.  1 07/08/2017 at Unknown time  . naproxen sodium (ALEVE) 220 MG tablet Take 220 mg by mouth daily as needed (pain).   Past Week at Unknown time  . albuterol (PROVENTIL HFA;VENTOLIN HFA) 108 (90 Base) MCG/ACT inhaler Inhale 2 puffs into the lungs every 6 (six) hours as needed for wheezing or shortness of breath.   Unknown at Unknown time  . ibuprofen (ADVIL,MOTRIN) 800 MG tablet Take 1 tablet (800 mg total) by mouth every 8 (eight) hours as needed. (Patient not taking: Reported on 07/05/2017) 30 tablet 4 Not Taking at Unknown time  . loratadine (CLARITIN) 10 MG tablet Take 1 tablet (10 mg total) by mouth daily. (Patient not taking: Reported on 10/31/2015)   Not Taking at Unknown time  . predniSONE (DELTASONE) 10 MG tablet Take 4 tablets a day on 4/2 and 4/3, then 2 tablets a day on 4/4 and 4/5, then 1 tablet a day on 4/6 and 4/7 then stop (Patient not taking:  Reported on 07/05/2017) 14 tablet 0 Not Taking at Unknown time    Review of Systems  All other systems reviewed and are negative.   Blood pressure (!) 144/101, pulse 68, temperature 98.1 F (36.7 C), temperature source Oral, resp. rate 16, height 5\' 8"  (1.727 m), weight 102.1 kg (225 lb), last menstrual period 06/09/2017, SpO2 100 %. Physical Exam  Constitutional: She is oriented to person, place, and time. She appears well-developed and well-nourished.  HENT:  Head: Atraumatic.  Neck: Neck supple.  Cardiovascular: Regular  rhythm.  Respiratory: Breath sounds normal.  GI: Soft. Bowel sounds are normal.  Genitourinary:  Genitourinary Comments: Adnexa nl Cervix nl Uterus enlarged irreg Vagina nl  Neurological: She is alert and oriented to person, place, and time.  Skin: Skin is warm and dry.  Psychiatric: She has a normal mood and affect.    Results for orders placed or performed during the hospital encounter of 07/09/17 (from the past 24 hour(s))  CBC     Status: Abnormal   Collection Time: 07/09/17  9:20 AM  Result Value Ref Range   WBC 8.1 4.0 - 10.5 K/uL   RBC 4.80 3.87 - 5.11 MIL/uL   Hemoglobin 11.5 (L) 12.0 - 15.0 g/dL   HCT 36.7 36.0 - 46.0 %   MCV 76.5 (L) 78.0 - 100.0 fL   MCH 24.0 (L) 26.0 - 34.0 pg   MCHC 31.3 30.0 - 36.0 g/dL   RDW 20.5 (H) 11.5 - 15.5 %   Platelets 323 150 - 400 K/uL    No results found.  Assessment/Plan: Menorrhagia with regular cycles  Fibroid uterus P) dx hysteroscopy, D&C, endometrial ablation. Risk of surgery includes infection, bleeding, injury to surrounding organ structures, 90% reduction in flow, thermal injury. All ? answered  Zein Helbing A Earlie Arciga 07/09/2017, 10:33 AM

## 2017-07-09 NOTE — Anesthesia Procedure Notes (Signed)
Procedure Name: LMA Insertion Date/Time: 07/09/2017 10:48 AM Performed by: Hewitt Blade, CRNA Pre-anesthesia Checklist: Patient identified, Emergency Drugs available, Suction available and Patient being monitored Patient Re-evaluated:Patient Re-evaluated prior to induction Oxygen Delivery Method: Circle system utilized Preoxygenation: Pre-oxygenation with 100% oxygen Induction Type: IV induction LMA: LMA flexible inserted LMA Size: 4.0 Number of attempts: 1 Placement Confirmation: positive ETCO2 and breath sounds checked- equal and bilateral Tube secured with: Tape Dental Injury: Teeth and Oropharynx as per pre-operative assessment

## 2017-07-10 ENCOUNTER — Encounter (HOSPITAL_COMMUNITY): Payer: Self-pay | Admitting: Obstetrics and Gynecology

## 2017-07-10 NOTE — Op Note (Signed)
NAME: Theresa Mann, Theresa Mann MEDICAL RECORD [id number] ACCOUNT [id number] DATE OF BIRTH:[birth year] FACILITY: [facility] LOCATION: [facility] PHYSICIAN:Zandra Lajeunesse A. Merrit Waugh, MD  OPERATIVE REPORT  DATE OF PROCEDURE:  07/09/2017  PREOPERATIVE DIAGNOSES:  Menorrhagia with regular cycles, fibroid uterus.  PROCEDURE PERFORMED:  Diagnostic hysteroscopy, dilation and curettage, NovaSure endometrial ablation.  POSTOPERATIVE DIAGNOSES:  Menorrhagia with regular cycles, endometrial polyp.  ANESTHESIA:  General.  SURGEON:  Servando Salina, MD  ASSISTANT:  None.  DESCRIPTION OF PROCEDURE:  Under adequate general anesthesia, the patient was placed in the dorsal lithotomy position.  She was sterilely prepped and draped in the usual fashion.  The bladder was catheterized for a small amount of urine.  Examination  under anesthesia revealed an anteverted uterus, slightly enlarged.  No adnexal masses could be appreciated.  Bivalve speculum was placed in the vagina.  A single-tooth tenaculum was placed on the anterior lip of the cervix.  The cervix was dilated up to #[facility] dilator.  The uterus sounded to 10 cm.  The endocervical length was 3.5 cm.  The diagnostic hysteroscope was then inserted.  Both tubal ostia could be seen.  On the anterior lower uterine segment was an endometrial polyp.  The hysteroscope was removed.  Curettage was done with removal of the polyp and some endometrial curettings.  The NovaSure endometrial ablation apparatus was then inserted.  A cavity width of 3.5 was noted.  Using 125 power watts, 1 minute and 3 seconds of ablation was accomplished.  The endometrial ablation apparatus was then removed.  The diagnostic hysteroscope was inserted, notable for good ablation throughout.  The procedure was then terminated by removing all instruments from the vagina.  SPECIMENS:  Endometrial curettings with polyp sent to pathology.  ESTIMATED BLOOD LOSS:  Minimal.  COMPLICATIONS:  None.  The  patient tolerated the procedure well and was transferred to recovery in stable condition.  LN/NUANCE  D:07/09/2017 T:07/10/2017 JOB:[id number]

## 2017-10-02 ENCOUNTER — Encounter (HOSPITAL_COMMUNITY): Payer: Self-pay | Admitting: Emergency Medicine

## 2017-10-02 ENCOUNTER — Emergency Department (HOSPITAL_COMMUNITY)
Admission: EM | Admit: 2017-10-02 | Discharge: 2017-10-02 | Disposition: A | Discharge status: Home / Self Care | Payer: Managed Care, Other (non HMO) | Attending: Emergency Medicine | Admitting: Emergency Medicine

## 2017-10-02 ENCOUNTER — Emergency Department (HOSPITAL_COMMUNITY): Payer: Managed Care, Other (non HMO)

## 2017-10-02 ENCOUNTER — Other Ambulatory Visit: Payer: Self-pay

## 2017-10-02 DIAGNOSIS — Z79899 Other long term (current) drug therapy: Secondary | ICD-10-CM | POA: Insufficient documentation

## 2017-10-02 DIAGNOSIS — J181 Lobar pneumonia, unspecified organism: Secondary | ICD-10-CM

## 2017-10-02 DIAGNOSIS — J189 Pneumonia, unspecified organism: Secondary | ICD-10-CM

## 2017-10-02 DIAGNOSIS — R0789 Other chest pain: Secondary | ICD-10-CM | POA: Insufficient documentation

## 2017-10-02 DIAGNOSIS — J45909 Unspecified asthma, uncomplicated: Secondary | ICD-10-CM | POA: Diagnosis not present

## 2017-10-02 DIAGNOSIS — E039 Hypothyroidism, unspecified: Secondary | ICD-10-CM | POA: Diagnosis not present

## 2017-10-02 DIAGNOSIS — R079 Chest pain, unspecified: Secondary | ICD-10-CM

## 2017-10-02 LAB — COMPREHENSIVE METABOLIC PANEL
ALT: 61 U/L — ABNORMAL HIGH (ref 0–44)
AST: 42 U/L — ABNORMAL HIGH (ref 15–41)
Albumin: 3.7 g/dL (ref 3.5–5.0)
Alkaline Phosphatase: 168 U/L — ABNORMAL HIGH (ref 38–126)
Anion gap: 10 (ref 5–15)
BUN: 11 mg/dL (ref 6–20)
CO2: 27 mmol/L (ref 22–32)
Calcium: 9.1 mg/dL (ref 8.9–10.3)
Chloride: 100 mmol/L (ref 98–111)
Creatinine, Ser: 1.1 mg/dL — ABNORMAL HIGH (ref 0.44–1.00)
GFR calc Af Amer: >60 mL/min (ref >60)
GFR calc non Af Amer: 58 mL/min — ABNORMAL LOW (ref >60)
Glucose, Bld: 85 mg/dL (ref 70–99)
Potassium: 3.5 mmol/L (ref 3.5–5.1)
Sodium: 137 mmol/L (ref 135–145)
Total Bilirubin: 0.6 mg/dL (ref 0.3–1.2)
Total Protein: 7.7 g/dL (ref 6.5–8.1)

## 2017-10-02 LAB — CBC WITH DIFFERENTIAL/PLATELET
Abs Immature Granulocytes: 0 10*3/uL (ref 0.0–0.1)
Basophils Absolute: 0.1 10*3/uL (ref 0.0–0.1)
Basophils Relative: 1 %
Eosinophils Absolute: 0.5 10*3/uL (ref 0.0–0.7)
Eosinophils Relative: 7 %
HCT: 41.3 % (ref 36.0–46.0)
Hemoglobin: 12.7 g/dL (ref 12.0–15.0)
Immature Granulocytes: 0 %
Lymphocytes Relative: 28 %
Lymphs Abs: 2 10*3/uL (ref 0.7–4.0)
MCH: 26.3 pg (ref 26.0–34.0)
MCHC: 30.8 g/dL (ref 30.0–36.0)
MCV: 85.7 fL (ref 78.0–100.0)
Monocytes Absolute: 0.4 10*3/uL (ref 0.1–1.0)
Monocytes Relative: 6 %
Neutro Abs: 4.1 10*3/uL (ref 1.7–7.7)
Neutrophils Relative %: 58 %
Platelets: 324 10*3/uL (ref 150–400)
RBC: 4.82 MIL/uL (ref 3.87–5.11)
RDW: 17.1 % — ABNORMAL HIGH (ref 11.5–15.5)
WBC: 7 10*3/uL (ref 4.0–10.5)

## 2017-10-02 LAB — I-STAT TROPONIN, ED: Troponin i, poc: 0 ng/mL (ref 0.00–0.08)

## 2017-10-02 MED ORDER — CYCLOBENZAPRINE HCL 10 MG PO TABS
10.0000 mg | ORAL_TABLET | Freq: Once | ORAL | Status: AC
  Administered 2017-10-02: 10 mg via ORAL
  Filled 2017-10-02: qty 1

## 2017-10-02 MED ORDER — AZITHROMYCIN 250 MG PO TABS: ORAL_TABLET | ORAL | 0 refills | Status: DC

## 2017-10-02 MED ORDER — ACETAMINOPHEN 500 MG PO TABS
1000.0000 mg | ORAL_TABLET | Freq: Once | ORAL | Status: AC
  Administered 2017-10-02: 1000 mg via ORAL
  Filled 2017-10-02: qty 2

## 2017-10-02 MED ORDER — AZITHROMYCIN 250 MG PO TABS: ORAL_TABLET | ORAL | 0 refills | Status: AC

## 2017-10-02 MED ORDER — CYCLOBENZAPRINE HCL 5 MG PO TABS: 5.0000 mg | ORAL_TABLET | Freq: Three times a day (TID) | ORAL | 0 refills | Status: AC | PRN

## 2017-10-02 NOTE — Discharge Instructions (Addendum)
Please take your prescribed azithromycin antibiotic as directed for suspected lung infection. You can also take 1 Flexeril as needed for muscle spasm three times a day.   Contact a doctor if: You have a fever. You lose sleep because your cough medicine does not help. Get help right away if: You are short of breath and it gets worse. You have more chest pain. Your sickness gets worse. This is very serious if: You are an older adult. Your body's defense system is weak. You cough up blood.

## 2017-10-02 NOTE — ED Provider Notes (Signed)
[place] EMERGENCY DEPARTMENT Provider Note   CSN: [id number] Arrival date & time: 10/02/17  0854     History   Chief Complaint Chief Complaint  Patient presents with  . Chest Pain    HPI Theresa Mann is a 47 y.o. female with a history of anemia, hypothyroidism, asthma who presents with chest pain.  Patient reports that 9 days prior to presentation she had an abrupt onset of left-sided chest pain (under left breast) while she was driving. She reports that her symptoms have been constant but improving since onset. She initially suspected that her symptoms were due to indigestion but symptoms were not alleviated with Tums. She also took an old prescription of muscle relaxer's without improvement of chest pain. Patient's left-sided chest pain kept her up last night. This morning she took 4 Tylenol with some alleviation of symptoms.   Patient endorses 1 week of cough and congestion. She also endorses decreased PO intake over the last week. She denies that the chest pain is worse with breathing. Denies recent sick contacts.  Denies SOB, chest pain, fever. Denies changes in bowel movements and urinary symptoms. 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    Past Medical History:  Diagnosis Date  . Allergic   . Anemia   . Asthma    seasoanl - rarely uses inhaler  . Heart murmur   . Hypothyroidism   . SVD (spontaneous vaginal delivery)    x 2  . Swelling 2017   LIPS, ARMS AND HAND   . Thyroid disease     Patient Active Problem List   Diagnosis Date Noted  . Angioedema 06/07/2015  . Pain in lower limb 03/13/2015  . 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 Hallux limitus, acquired 03/13/2015  . Bunion 03/13/2015    Past Surgical History:  Procedure Laterality Date  . BREAST SURGERY Right    benign cyst removed  . CHOLECYSTECTOMY    . DILATATION & CURETTAGE/HYSTEROSCOPY WITH MYOSURE N/A 11/08/2015   Procedure: DILATATION &  CURETTAGE/HYSTEROSCOPY WITH MYOSURE;  Surgeon: Servando Salina, MD;  Location: [facility];  Service: Gynecology;  Laterality: N/A;  . DILATATION & CURETTAGE/HYSTEROSCOPY WITH MYOSURE N/A 07/09/2017   Procedure: DILATATION & CURETTAGE/HYSTEROSCOPY;  Surgeon: Servando Salina, MD;  Location: [facility];  Service: Gynecology;  Laterality: N/A;  . HYSTEROSCOPY WITH NOVASURE N/A 07/09/2017   Procedure: HYSTEROSCOPY WITH NOVASURE;  Surgeon: Servando Salina, MD;  Location: [facility];  Service: Gynecology;  Laterality: N/A;  . TUBAL LIGATION       OB History   None      Home Medications    Prior to Admission medications   Medication Sig Start Date End Date Taking? Authorizing Provider  aspirin-sod bicarb-citric acid (ALKA-SELTZER) 325 MG TBEF tablet Take 650 mg by mouth every 6 (six) hours as needed (Indigestion).   Yes [provider]  hydrochlorothiazide (HYDRODIURIL) 25 MG tablet Take 25 mg by mouth daily. 09/22/17  Yes [provider]  ibuprofen (ADVIL,MOTRIN) 200 MG tablet Take 600 mg by mouth every 6 (six) hours as needed for fever, headache or moderate pain.   Yes [provider]  levothyroxine (SYNTHROID, LEVOTHROID) 150 MCG tablet Take 150 mcg by mouth daily. 06/03/17  Yes [provider]  SF 5000 PLUS 1.1 % CREA dental cream Place 1 application onto teeth daily. 08/17/17  Yes [provider]  albuterol (PROVENTIL HFA;VENTOLIN HFA) 108 (90 Base) MCG/ACT inhaler Inhale 2 puffs into  the lungs every 6 (six) hours as needed for wheezing or shortness of breath.    [provider]  azithromycin (ZITHROMAX Z-PAK) 250 MG tablet Take 2 tablets (500 mg) on  Day 1,  followed by 1 tablet (250 mg) once daily on Days 2 through 5. 10/02/17 10/07/17  Carroll Sage, MD  cyclobenzaprine (FLEXERIL) 5 MG tablet Take 1 tablet (5 mg total) by mouth 3 (three) times daily as needed for up to 7 days for muscle spasms. 10/02/17 10/09/17  Carroll Sage, MD  ibuprofen  (ADVIL,MOTRIN) 800 MG tablet Take 1 tablet (800 mg total) by mouth every 8 (eight) hours as needed. Patient not taking: Reported on 07/05/2017 11/08/15   Servando Salina, MD  loratadine (CLARITIN) 10 MG tablet Take 1 tablet (10 mg total) by mouth daily. Patient not taking: Reported on 10/31/2015 06/08/15   Cherene Altes, MD    Family History History reviewed. No pertinent family history.  Social History Social History   Tobacco Use  . Smoking status: Never Smoker  . Smokeless tobacco: Never Used  Substance Use Topics  . Alcohol use: Yes    Alcohol/week: 1.2 oz    Types: 2 Glasses of wine per week  . Drug use: No     Allergies   Clindamycin/lincomycin; Penicillins; Milk-related compounds; and Pollen extract   Review of Systems Review of Systems  Constitutional: Negative for activity change, appetite change and fever.  HENT: Negative for rhinorrhea and sore throat.   Eyes: Negative for visual disturbance.  Respiratory: Negative for chest tightness and shortness of breath.   Cardiovascular: Positive for chest pain. Negative for leg swelling.  Gastrointestinal: Negative for abdominal distention, abdominal pain, constipation, diarrhea, nausea and vomiting.  Genitourinary: Negative for dysuria, hematuria and urgency.  Skin: Negative for rash.  Neurological: Negative for dizziness, weakness, light-headedness and numbness.  All other systems reviewed and are negative.    Physical Exam Updated Vital Signs BP (!) 142/106 (BP Location: Right Arm)   Pulse 93   Temp 98.2 F (36.8 C) (Oral)   Resp 17   Ht 5\' 8"  (1.727 m)   Wt 95.3 kg (210 lb)   LMP 09/07/2017 Comment: has ablation so usually just has spotting  SpO2 100%   BMI 31.93 kg/m   Physical Exam  Constitutional: She appears well-developed.  HENT:  Head: Normocephalic and atraumatic.  Eyes: EOM are normal.  Neck: Normal range of motion.  Cardiovascular: Normal rate, regular rhythm, intact distal pulses and  normal pulses.  Pulmonary/Chest: Effort normal and breath sounds normal.  Abdominal: Soft. Bowel sounds are normal.  Musculoskeletal: Normal range of motion.       Right lower leg: Normal.       Left lower leg: Normal.  Moderate tenderness to palpation of chest wall, under left breast. Mild tenderness to palpation of anterior chest wall.   Neurological: She is alert.  Skin: Skin is warm and dry.  Psychiatric: She has a normal mood and affect. Her behavior is normal.  Nursing note and vitals reviewed.    ED Treatments / Results  Labs (all labs ordered are listed, but only abnormal results are displayed) Labs Reviewed  COMPREHENSIVE METABOLIC PANEL - Abnormal; Notable for the following components:      Result Value   Creatinine, Ser 1.10 (*)    AST 42 (*)    ALT 61 (*)    Alkaline Phosphatase 168 (*)    GFR calc non Af Amer 58 (*)  All other components within normal limits  CBC WITH DIFFERENTIAL/PLATELET - Abnormal; Notable for the following components:   RDW 17.1 (*)    All other components within normal limits  I-STAT TROPONIN, ED    EKG None  Radiology Dg Chest 2 View  Result Date: 10/02/2017 CLINICAL DATA:  Left-sided chest pain and epigastric pain. EXAM: CHEST - 2 VIEW COMPARISON:  None. FINDINGS: Cardiomediastinal silhouette is normal. Mediastinal contours appear intact. There is no evidence of pleural effusion or pneumothorax. Mild peribronchial linear airspace opacities in the left lower lobe. Volume loss in the left hemithorax. Osseous structures are without acute abnormality. Soft tissues are grossly normal. IMPRESSION: Volume loss in the left hemithorax with mild peribronchial linear airspace opacities in the left lower lobe which may represent atelectasis versus airspace consolidation. Electronically Signed   By: Fidela Salisbury M.D.   On: 10/02/2017 09:26    Procedures Procedures (including critical care time)  Medications Ordered in ED Medications - No  data to display   Initial Impression / Assessment and Plan / ED Course  I have reviewed the triage vital signs and the nursing notes.  Pertinent labs & imaging results that were available during my care of the patient were reviewed by me and considered in my medical decision making (see chart for details).  Karyl A Sortino is a 47 y.o. female with a history of anemia, hypothyroidism, asthma who presents with chest pain, cough, congestion. Patient found to have mild peribronchial linear airspace opacities in the left lower lobe and elevated creatinine 1.10 (up from 0.83 2 years ago). Differential diagnosis includes Community Acquired Pneumonia vs musculoskeletal pain. Increase in creatinine is likely pre-renal in nature (decreased PO intake). Patient is currently hemodynamically stable and does not have any major co-morbidities and would be appropriate to treat outpatient. CURB-65 score: 0 (0.6% 30-day mortality). I prescribed Azithromycin 500 mg day 1, 250 mg for 4 days for Community Acquired Pneumonia. I also prescribed Flexeril as needed for musculoskeletal pain. Patient given return precautions and verbalized understanding/agreement.   Final Clinical Impressions(s) / ED Diagnoses   Final diagnoses:  Chest pain, unspecified type    ED Discharge Orders    None       Carroll Sage, MD 10/02/17 1553    Elnora Morrison, MD 10/03/17 1610

## 2017-10-02 NOTE — ED Triage Notes (Signed)
Pain in chest on left side worse when lying down. Took ibuprofen which did help some and got up. Constant pain that feels like squeezing, throbbing, twisting pain. States she took muscle relaxer did not help. Denies vomiting or nausea- some mild sweating.

## 2017-10-02 NOTE — ED Notes (Signed)
Patient transported to X-ray 

## 2019-02-10 ENCOUNTER — Other Ambulatory Visit: Payer: Self-pay

## 2019-02-10 DIAGNOSIS — Z20822 Contact with and (suspected) exposure to covid-19: Secondary | ICD-10-CM

## 2019-02-11 LAB — NOVEL CORONAVIRUS, NAA: SARS-CoV-2, NAA: NOT DETECTED

## 2019-03-06 ENCOUNTER — Other Ambulatory Visit: Payer: Self-pay | Admitting: Gastroenterology

## 2019-03-06 DIAGNOSIS — R1012 Left upper quadrant pain: Secondary | ICD-10-CM

## 2019-03-06 DIAGNOSIS — R634 Abnormal weight loss: Secondary | ICD-10-CM

## 2019-03-14 ENCOUNTER — Other Ambulatory Visit: Payer: Managed Care, Other (non HMO)

## 2019-10-16 ENCOUNTER — Other Ambulatory Visit: Payer: Self-pay | Admitting: Physician Assistant

## 2019-10-16 DIAGNOSIS — Z1231 Encounter for screening mammogram for malignant neoplasm of breast: Secondary | ICD-10-CM

## 2019-11-02 ENCOUNTER — Other Ambulatory Visit: Payer: Self-pay

## 2019-11-02 ENCOUNTER — Ambulatory Visit
Admission: RE | Admit: 2019-11-02 | Discharge: 2019-11-02 | Disposition: A | Discharge status: Home / Self Care | Payer: 59 | Source: Ambulatory Visit | Attending: Physician Assistant | Admitting: Physician Assistant

## 2019-11-02 DIAGNOSIS — Z1231 Encounter for screening mammogram for malignant neoplasm of breast: Secondary | ICD-10-CM

## 2020-03-23 ENCOUNTER — Other Ambulatory Visit: Payer: Self-pay

## 2020-03-23 ENCOUNTER — Emergency Department (HOSPITAL_COMMUNITY)
Admission: EM | Admit: 2020-03-23 | Discharge: 2020-03-24 | Disposition: A | Discharge status: Home / Self Care | Payer: Managed Care, Other (non HMO) | Attending: Emergency Medicine | Admitting: Emergency Medicine

## 2020-03-23 DIAGNOSIS — Z7982 Long term (current) use of aspirin: Secondary | ICD-10-CM | POA: Diagnosis not present

## 2020-03-23 DIAGNOSIS — E039 Hypothyroidism, unspecified: Secondary | ICD-10-CM | POA: Insufficient documentation

## 2020-03-23 DIAGNOSIS — R519 Headache, unspecified: Secondary | ICD-10-CM | POA: Diagnosis not present

## 2020-03-23 DIAGNOSIS — H538 Other visual disturbances: Secondary | ICD-10-CM | POA: Diagnosis not present

## 2020-03-23 DIAGNOSIS — J45909 Unspecified asthma, uncomplicated: Secondary | ICD-10-CM | POA: Insufficient documentation

## 2020-03-23 DIAGNOSIS — H5371 Glare sensitivity: Secondary | ICD-10-CM | POA: Diagnosis not present

## 2020-03-23 DIAGNOSIS — G4459 Other complicated headache syndrome: Secondary | ICD-10-CM

## 2020-03-23 NOTE — ED Triage Notes (Signed)
Pt presents to ED POv. Pt c/o HA. Pt also c/o photophobia, blurred vision.  Pt reports that she had LP yesterday. Pt has hx of increased ICP.

## 2020-03-24 ENCOUNTER — Emergency Department (HOSPITAL_COMMUNITY): Payer: Managed Care, Other (non HMO)

## 2020-03-24 MED ORDER — KETOROLAC TROMETHAMINE 30 MG/ML IJ SOLN
30.0000 mg | Freq: Once | INTRAMUSCULAR | Status: AC
  Administered 2020-03-24: 30 mg via INTRAVENOUS
  Filled 2020-03-24: qty 1

## 2020-03-24 MED ORDER — IBUPROFEN 400 MG PO TABS
600.0000 mg | ORAL_TABLET | Freq: Once | ORAL | Status: AC
  Administered 2020-03-24: 600 mg via ORAL
  Filled 2020-03-24: qty 1

## 2020-03-24 MED ORDER — SODIUM CHLORIDE 0.9 % IV BOLUS
1000.0000 mL | Freq: Once | INTRAVENOUS | Status: AC
  Administered 2020-03-24: 1000 mL via INTRAVENOUS

## 2020-03-24 NOTE — Discharge Instructions (Addendum)
Call [facility] imaging as we discussed.  Their office location and phone number are in your discharge paperwork. I have attempted to place an outpatient order for you to have the blood patch procedure.   -You should also try to follow-up with your neurologist as soon as possible.  No eating or drinking after midnight the night before you are scheduled for the blood patch procedure.  Continue usual home medications and try adding tylenol for headaches.  Return to the emergency department with any new or worsening symptoms.

## 2020-03-24 NOTE — ED Provider Notes (Signed)
[facility] EMERGENCY DEPARTMENT Provider Note   CSN: [id number] Arrival date & time: 03/23/20  2235     History Chief Complaint  Patient presents with  . Headache    Theresa Mann is a 50 y.o. female past medical history significant for anemia, heart murmur, hypothyroidism, idiopathic intracranial hypertension. Followed by [facility].  HPI Patient presents to emergency room today with chief complaint of headache x3 days. Had LP times x 2 days ago.  Opening pressure was 32 and closing pressure was 13 with 13 mL of clear fluid sent for testing.  Patient has had a total of 2 LPs for her IIH. Patient states after her first LP her headache resolved completely.  After the one she had just recently her headache had no change.  She has been taking her Topamax and Maxalt without any symptom improvement.  She states that headache she has now has progressively worsened.  It is located in the front and back of her head.  It is constant.  She describes it like a throbbing and aching sensation.  She denies any associated neck pain or stiffness.  She also feels like her left eye is pulsing.  She has associated photophobia and intermittent blurred vision.  She denies any fever, chills, back pain, numbness, tingling, weakness, fall or head trauma, gait abnormality. She reports compliance with her medications.  Chart review from neurology note 03/07/20: - if she requires serial LPs and does not have weight loss she will need neurosurgery eval for possible shunt placement.  Plan was to follow-up with weight neurology in 6 weeks.  Past Medical History:  Diagnosis Date  . Allergic   . Anemia   . Asthma    seasoanl - rarely uses inhaler  . Heart murmur   . Hypothyroidism   . SVD (spontaneous vaginal delivery)    x 2  . Swelling 2017   LIPS, ARMS AND HAND   . Thyroid disease     Patient Active Problem List   Diagnosis Date Noted  . Angioedema 06/07/2015  . Pain in  lower limb 03/13/2015  . 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 Hallux limitus, acquired 03/13/2015  . Bunion 03/13/2015    Past Surgical History:  Procedure Laterality Date  . BREAST CYST ASPIRATION Left   . BREAST SURGERY Right    benign cyst removed  . CHOLECYSTECTOMY    . DILATATION & CURETTAGE/HYSTEROSCOPY WITH MYOSURE N/A 11/08/2015   Procedure: DILATATION & CURETTAGE/HYSTEROSCOPY WITH MYOSURE;  Surgeon: Servando Salina, MD;  Location: [facility];  Service: Gynecology;  Laterality: N/A;  . DILATATION & CURETTAGE/HYSTEROSCOPY WITH MYOSURE N/A 07/09/2017   Procedure: DILATATION & CURETTAGE/HYSTEROSCOPY;  Surgeon: Servando Salina, MD;  Location: [facility];  Service: Gynecology;  Laterality: N/A;  . HYSTEROSCOPY WITH NOVASURE N/A 07/09/2017   Procedure: HYSTEROSCOPY WITH NOVASURE;  Surgeon: Servando Salina, MD;  Location: [facility];  Service: Gynecology;  Laterality: N/A;  . TUBAL LIGATION       OB History   No obstetric history on file.     Family History  Problem Relation Age of Onset  . Breast cancer Maternal Grandmother     Social History   Tobacco Use  . Smoking status: Never Smoker  . Smokeless tobacco: Never Used  Vaping Use  . Vaping Use: Never used  Substance Use Topics  . Alcohol use: Yes    Alcohol/week: 2.0 standard drinks    Types: 2 Glasses of wine per week  . Drug use: No  Home Medications Prior to Admission medications   Medication Sig Start Date End Date Taking? Authorizing Provider  albuterol (PROVENTIL HFA;VENTOLIN HFA) 108 (90 Base) MCG/ACT inhaler Inhale 2 puffs into the lungs every 6 (six) hours as needed for wheezing or shortness of breath.    [provider]  aspirin-sod bicarb-citric acid (ALKA-SELTZER) 325 MG TBEF tablet Take 650 mg by mouth every 6 (six) hours as needed (Indigestion).    [provider]  hydrochlorothiazide (HYDRODIURIL) 25 MG tablet Take 25 mg by mouth daily. 09/22/17   [provider]  ibuprofen  (ADVIL,MOTRIN) 200 MG tablet Take 600 mg by mouth every 6 (six) hours as needed for fever, headache or moderate pain.    [provider]  ibuprofen (ADVIL,MOTRIN) 800 MG tablet Take 1 tablet (800 mg total) by mouth every 8 (eight) hours as needed. Patient not taking: Reported on 07/05/2017 11/08/15   Servando Salina, MD  levothyroxine (SYNTHROID, LEVOTHROID) 150 MCG tablet Take 150 mcg by mouth daily. 06/03/17   [provider]  loratadine (CLARITIN) 10 MG tablet Take 1 tablet (10 mg total) by mouth daily. Patient not taking: Reported on 10/31/2015 06/08/15   Cherene Altes, MD  SF 5000 PLUS 1.1 % CREA dental cream Place 1 application onto teeth daily. 08/17/17   [provider]    Allergies    Clindamycin/lincomycin, Penicillins, Milk-related compounds, and Pollen extract  Review of Systems   Review of Systems All other systems are reviewed and are negative for acute change except as noted in the HPI.  Physical Exam Updated Vital Signs BP 126/77 (BP Location: Right Arm)   Pulse 66   Temp 98.2 F (36.8 C) (Oral)   Resp 16   SpO2 100%   Physical Exam Vitals and nursing note reviewed.  Constitutional:      General: She is not in acute distress.    Appearance: She is not ill-appearing.  HENT:     Head: Normocephalic and atraumatic.     Comments: No sinus or temporal tenderness.    Right Ear: Tympanic membrane and external ear normal.     Left Ear: Tympanic membrane and external ear normal.     Nose: Nose normal.     Mouth/Throat:     Mouth: Mucous membranes are moist.     Pharynx: Oropharynx is clear.  Eyes:     General: No scleral icterus.       Right eye: No discharge.        Left eye: No discharge.     Extraocular Movements: Extraocular movements intact.     Conjunctiva/sclera: Conjunctivae normal.     Pupils: Pupils are equal, round, and reactive to light.  Neck:     Vascular: No JVD.     Comments: No meningeal signs Cardiovascular:      Rate and Rhythm: Normal rate and regular rhythm.     Pulses: Normal pulses.          Radial pulses are 2+ on the right side and 2+ on the left side.     Heart sounds: Normal heart sounds.  Pulmonary:     Comments: Lungs clear to auscultation in all fields. Symmetric chest rise. No wheezing, rales, or rhonchi. Abdominal:     Comments: Abdomen is soft, non-distended, and non-tender in all quadrants. No rigidity, no guarding. No peritoneal signs.  Musculoskeletal:        General: Normal range of motion.     Cervical back: Normal range of motion.  Skin:    General: Skin is warm and dry.     Capillary Refill: Capillary refill takes less than 2 seconds.     Findings: No rash.     Comments: LP site without any surrounding edema or erythema. No signs of infection. Non tender to palpation.   Neurological:     Mental Status: She is oriented to person, place, and time.     GCS: GCS eye subscore is 4. GCS verbal subscore is 5. GCS motor subscore is 6.     Comments: Mental Status:  Alert, oriented, thought content appropriate, able to give a coherent history. Speech fluent without evidence of aphasia. Able to follow 2 step commands without difficulty.  Cranial Nerves:  II:  Peripheral visual fields grossly normal, pupils equal, round, reactive to light III,IV, VI: ptosis not present, extra-ocular motions intact bilaterally  V,VII: smile symmetric, facial light touch sensation equal VIII: hearing grossly normal to voice  X: uvula elevates symmetrically  XI: bilateral shoulder shrug symmetric and strong XII: midline tongue extension without fassiculations Motor:  Normal tone. 5/5 in upper and lower extremities bilaterally including strong and equal grip strength and dorsiflexion/plantar flexion Sensory: Pinprick and light touch normal in all extremities.  Deep Tendon Reflexes: 2+ and symmetric in the biceps and patella Cerebellar: normal finger-to-nose with bilateral upper extremities Gait: normal  gait and balance CV: distal pulses palpable throughout     Psychiatric:        Behavior: Behavior normal.     ED Results / Procedures / Treatments   Labs (all labs ordered are listed, but only abnormal results are displayed) Labs Reviewed - No data to display  EKG None  Radiology CT Head Wo Contrast  Result Date: 03/24/2020 CLINICAL DATA:  Worsening headache EXAM: CT HEAD WITHOUT CONTRAST TECHNIQUE: Contiguous axial images were obtained from the base of the skull through the vertex without intravenous contrast. COMPARISON:  None. FINDINGS: Brain: No evidence of acute territorial infarction, hemorrhage, hydrocephalus,extra-axial collection or mass lesion/mass effect. There is mild low-attenuation changes in the deep white matter consistent with small vessel ischemia. Ventricles are normal in size and contour. Vascular: No hyperdense vessel or unexpected calcification. Skull: The skull is intact. No fracture or focal lesion identified. Sinuses/Orbits: The visualized paranasal sinuses and mastoid air cells are clear. The orbits and globes intact. Other: None IMPRESSION: No acute intracranial abnormality. There is scattered mild white matter changes which could be due to accelerated/hereditary small vessel ischemia, sequelae of trauma, hypercoagulable state, vasculitis, migraines, prior infection or demyelination. Electronically Signed   By: Prudencio Pair M.D.   On: 03/24/2020 00:35    Procedures Procedures (including critical care time)  Medications Ordered in ED Medications  ibuprofen (ADVIL) tablet 600 mg (600 mg Oral Given by Other 03/24/20 0300)  sodium chloride 0.9 % bolus 1,000 mL (1,000 mLs Intravenous New Bag/Given 03/24/20 0838)  ketorolac (TORADOL) 30 MG/ML injection 30 mg (30 mg Intravenous Given 03/24/20 2355)    ED Course  I have reviewed the triage vital signs and the nursing notes.  Pertinent labs & imaging results that were available during my care of the patient were  reviewed by me and considered in my medical decision making (see chart for details).    MDM Rules/Calculators/A&P                          History provided by patient with additional history obtained from chart review.    [place]  yo female presenting with headache after outpatient LP x 2 days ago. Afebrile, HDS. On exam patient is non toxic appearing. Normal neuro exam, no focal weakness. No sinus or temporal tenderness.No meningeal signs. Denies change of pregnancy.   CT head ordered in triage. I viewed results which show no acute intracranial abnormalities. Radiologist does comment on there being  scattered mild white matter changes which could be due to accelerated/hereditary small vessel ischemia, sequelae of trauma, hypercoagulable state, vasculitis, migraines, prior infection or demyelination. On exam no signs of meningitis, no weakness/sensory deficits/abnormal reflexes to suggest MS. Headache is likely post procedural as she had pressure dropped 19 during LP. Patient given IVF and Toradol. Consulted on-call ID attending Dr. Anselm Pancoast. Unfortunately with it being a weekend day there is not staff for blood patch procedure. Recommend she  follow up with her neurologist as soon as possible about headache. Will try to facilitate [facility] to have outpatient blood patch tomorrow (or next day as tomorrow is a holiday), incase she has difficulty with her neurologist follow up.   On reassessment patient admits headache has improved.  She feels it is tolerable and she can manage her symptoms at home.  Discharge order for blood patch has been placed for clean prior imaging.  Strict return precautions discussed.  Patient discharged home in stable condition. Findings and plan of care discussed with supervising physician Dr. Karle Starch.  Portions of this note were generated with [employer]. Dictation errors may occur despite best attempts at proofreading.   Final Clinical  Impression(s) / ED Diagnoses Final diagnoses:  Acute nonintractable headache, unspecified headache type    Rx / DC Orders ED Discharge Orders         Ordered    DG FLUORO GUIDED LOC OF NEEDLE/CATH TIP FOR SPINAL INJECT LT        03/24/20 [place], Kaitlyn E, PA-C 03/24/20 1013    Truddie Hidden, MD 03/24/20 1051

## 2020-03-27 ENCOUNTER — Other Ambulatory Visit: Payer: Self-pay

## 2020-03-27 ENCOUNTER — Ambulatory Visit
Admission: RE | Admit: 2020-03-27 | Discharge: 2020-03-27 | Disposition: A | Discharge status: Home / Self Care | Payer: Managed Care, Other (non HMO) | Source: Ambulatory Visit | Attending: Physician Assistant | Admitting: Physician Assistant

## 2020-03-27 DIAGNOSIS — G4459 Other complicated headache syndrome: Secondary | ICD-10-CM

## 2020-03-27 MED ORDER — IOPAMIDOL (ISOVUE-M 200) INJECTION 41%
1.0000 mL | Freq: Once | INTRAMUSCULAR | Status: AC
  Administered 2020-03-27: 1 mL via EPIDURAL

## 2020-03-27 NOTE — Discharge Instructions (Signed)

## 2020-03-27 NOTE — Progress Notes (Signed)
15cc of Blood drawn from patients Left hand for blood patch procedure. 1 successful attempt. Pt tolerated well. Gauze applied afterwards.

## 2020-09-21 ENCOUNTER — Ambulatory Visit (HOSPITAL_COMMUNITY)
Admission: EM | Admit: 2020-09-21 | Discharge: 2020-09-21 | Disposition: A | Discharge status: Home / Self Care | Payer: Managed Care, Other (non HMO)

## 2020-09-21 ENCOUNTER — Encounter (HOSPITAL_COMMUNITY): Payer: Self-pay

## 2020-09-21 DIAGNOSIS — G609 Hereditary and idiopathic neuropathy, unspecified: Secondary | ICD-10-CM

## 2020-09-21 MED ORDER — GABAPENTIN 100 MG PO CAPS: 100.0000 mg | ORAL_CAPSULE | Freq: Three times a day (TID) | ORAL | 0 refills | Status: DC

## 2020-09-21 NOTE — ED Triage Notes (Signed)
Pt c/o bilateral legs feeling heavy, knee pain, tingling sensation. States that at night it is worse, it hurts to touch sometimes and she has had cellulitis before. Pt is able to ambulate. No swelling noted.  Started: Thursday  Interventions: just massaging area

## 2020-09-21 NOTE — ED Provider Notes (Signed)
[place]    CSN: [id number] Arrival date & time: 09/21/20  1007      History   Chief Complaint Chief Complaint  Patient presents with   Leg Pain    HPI Theresa Mann is a 50 y.o. female.   Pt complains of bilateral leg numbness and tingling that started about one week ago, but became worse about two days ago.  She reports legs feel "heavy" with pins and needle feeling that started the last two days.  She reports trying massage last night, but the skin was sensitive to touch.  She denies injury or trauma.  She denies swelling, redness, calf tenderness, weakness, back pain, saddle anesthesia.  Reports symptoms are worse at night, reports sheets bother her legs at night.  She works from home and sits throughout the day. She has tried tylenol with minimal relief.     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   Hallux limitus, acquired 03/13/2015   Bunion 03/13/2015    Past Surgical History:  Procedure Laterality Date   BREAST CYST ASPIRATION Left    BREAST SURGERY Right    benign cyst removed   CHOLECYSTECTOMY     DILATATION & CURETTAGE/HYSTEROSCOPY WITH MYOSURE N/A 11/08/2015   Procedure: [place];  Surgeon: Servando Salina, MD;  Location: [facility];  Service: Gynecology;  Laterality: N/A;   DILATATION & CURETTAGE/HYSTEROSCOPY WITH MYOSURE N/A 07/09/2017   Procedure: DILATATION & CURETTAGE/HYSTEROSCOPY;  Surgeon: Servando Salina, MD;  Location: [facility];  Service: Gynecology;  Laterality: N/A;   HYSTEROSCOPY WITH NOVASURE N/A 07/09/2017   Procedure: HYSTEROSCOPY WITH  NOVASURE;  Surgeon: Servando Salina, MD;  Location: [facility];  Service: Gynecology;  Laterality: N/A;   TUBAL LIGATION      OB History   No obstetric history on file.      Home Medications    Prior to Admission medications   Medication Sig Start Date End Date Taking? Authorizing Provider  Atogepant (QULIPTA) 60 MG TABS TAKE 1 TABLET(60 MG) BY MOUTH DAILY 03/29/20  Yes [provider]  albuterol (PROVENTIL HFA;VENTOLIN HFA) 108 (90 Base) MCG/ACT inhaler Inhale 2 puffs into the lungs every 6 (six) hours as needed for wheezing or shortness of breath.    [provider]  aspirin-sod bicarb-citric acid (ALKA-SELTZER) 325 MG TBEF tablet Take 650 mg by mouth every 6 (six) hours as needed (Indigestion).    [provider]  hydrochlorothiazide (HYDRODIURIL) 25 MG tablet Take 25 mg by mouth daily. 09/22/17   [provider]  ibuprofen (ADVIL,MOTRIN) 200 MG tablet Take 600 mg by mouth every 6 (six) hours as needed for fever, headache or moderate pain.    [provider]  ibuprofen (ADVIL,MOTRIN) 800 MG tablet Take 1 tablet (800 mg total) by mouth every 8 (eight) hours as needed. Patient not taking: Reported on 07/05/2017 11/08/15   Servando Salina, MD  levothyroxine (SYNTHROID, LEVOTHROID) 150 MCG tablet Take 150 mcg by mouth daily. 06/03/17   [provider]  loratadine (  CLARITIN) 10 MG tablet Take 1 tablet (10 mg total) by mouth daily. Patient not taking: Reported on 10/31/2015 06/08/15   Cherene Altes, MD  SAXENDA 18 MG/3ML SOPN SMARTSIG:0.5 Milliliter(s) SUB-Q Daily 08/27/20   [provider]  SF 5000 PLUS 1.1 % CREA dental cream Place 1 application onto teeth daily. 08/17/17   [provider]    Family History Family History  Problem Relation Age of Onset   Breast cancer Maternal Grandmother     Social History Social History   Tobacco Use   Smoking status: Never   Smokeless tobacco: Never  Vaping Use   Vaping  Use: Never used  Substance Use Topics   Alcohol use: Yes    Alcohol/week: 2.0 standard drinks    Types: 2 Glasses of wine per week   Drug use: No     Allergies   Clindamycin/lincomycin, Penicillins, Milk-related compounds, and Pollen extract   Review of Systems Review of Systems  Constitutional:  Negative for chills and fever.  HENT:  Negative for ear pain and sore throat.   Eyes:  Negative for pain and visual disturbance.  Respiratory:  Negative for cough and shortness of breath.   Cardiovascular:  Negative for chest pain and palpitations.  Gastrointestinal:  Negative for abdominal pain and vomiting.  Genitourinary:  Negative for dysuria and hematuria.  Musculoskeletal:  Positive for myalgias. Negative for arthralgias, back pain, gait problem and joint swelling.  Skin:  Negative for color change, rash and wound.  Neurological:  Positive for numbness. Negative for seizures and syncope.  All other systems reviewed and are negative.   Physical Exam Triage Vital Signs ED Triage Vitals  Enc Vitals Group     BP 09/21/20 1038 112/80     Pulse Rate 09/21/20 1038 92     Resp 09/21/20 1038 18     Temp 09/21/20 1038 97.6 F (36.4 C)     Temp Source 09/21/20 1038 Oral     SpO2 09/21/20 1038 98 %     Weight --      Height --      Head Circumference --      Peak Flow --      Pain Score 09/21/20 1033 7     Pain Loc --      Pain Edu? --      Excl. in [place]? --    No data found.  Updated Vital Signs BP 112/80 (BP Location: Left Arm)   Pulse 92   Temp 97.6 F (36.4 C) (Oral)   Resp 18   SpO2 98%   Visual Acuity Right Eye Distance:   Left Eye Distance:   Bilateral Distance:    Right Eye Near:   Left Eye Near:    Bilateral Near:     Physical Exam Vitals and nursing note reviewed.  Constitutional:      General: She is not in acute distress.    Appearance: She is well-developed.  HENT:     Head: Normocephalic and atraumatic.  Eyes:     Conjunctiva/sclera:  Conjunctivae normal.  Cardiovascular:     Rate and Rhythm: Normal rate and regular rhythm.     Heart sounds: No murmur heard. Pulmonary:     Effort: Pulmonary effort is normal. No respiratory distress.     Breath sounds: Normal breath sounds.  Abdominal:     Palpations: Abdomen is soft.     Tenderness: There is no abdominal tenderness.  Musculoskeletal:     Cervical back:  Neck supple.     Lumbar back: Negative right straight leg raise test and negative left straight leg raise test.     Comments: No calf swelling, tenderness, or redness noted.  Normal strength and sensation to lower extremities.    Skin:    General: Skin is warm and dry.  Neurological:     Mental Status: She is alert.     UC Treatments / Results  Labs (all labs ordered are listed, but only abnormal results are displayed) Labs Reviewed - No data to display  EKG   Radiology No results found.  Procedures Procedures (including critical care time)  Medications Ordered in UC Medications - No data to display  Initial Impression / Assessment and Plan / UC Course  I have reviewed the triage vital signs and the nursing notes.  Pertinent labs & imaging results that were available during my care of the patient were reviewed by me and considered in my medical decision making (see chart for details).     No PE signs of DVT, cellulitis, no SLR noted.  Sensation of numbness and tingling likely associated with neuropathy vs. Restless leg syndrome.  Pt reports sx are worse at night feeling pins and needles.  Will prescribe gabapentin.  Pt will follow up with primary care for further evaluation.  Consider requip in the future, consider neurology follow up if no improvement.  Final Clinical Impressions(s) / UC Diagnoses   Final diagnoses:  None   Discharge Instructions   None    ED Prescriptions   None    PDMP not reviewed this encounter.   Konrad Felix, PA-C 09/21/20 1105

## 2020-09-21 NOTE — Discharge Instructions (Addendum)
Recommend stretching daily Take gabapentin as prescribed Follow up with primary care physician for further evaluation  Return for concerning symptoms like calf redness, swelling, pain; shortness of breath, chest pain.

## 2021-04-05 ENCOUNTER — Other Ambulatory Visit: Payer: Self-pay | Admitting: Obstetrics and Gynecology

## 2021-04-12 ENCOUNTER — Other Ambulatory Visit: Payer: Self-pay | Admitting: Obstetrics and Gynecology

## 2021-04-12 DIAGNOSIS — R928 Other abnormal and inconclusive findings on diagnostic imaging of breast: Secondary | ICD-10-CM

## 2021-04-14 ENCOUNTER — Other Ambulatory Visit: Payer: Self-pay | Admitting: Obstetrics and Gynecology

## 2021-04-14 DIAGNOSIS — N6310 Unspecified lump in the right breast, unspecified quadrant: Secondary | ICD-10-CM

## 2021-04-24 ENCOUNTER — Encounter (HOSPITAL_COMMUNITY): Payer: Self-pay

## 2021-04-24 ENCOUNTER — Ambulatory Visit (HOSPITAL_COMMUNITY)
Admission: EM | Admit: 2021-04-24 | Discharge: 2021-04-24 | Disposition: A | Discharge status: Home / Self Care | Payer: 59 | Attending: Family Medicine | Admitting: Family Medicine

## 2021-04-24 DIAGNOSIS — L509 Urticaria, unspecified: Secondary | ICD-10-CM | POA: Diagnosis not present

## 2021-04-24 MED ORDER — PREDNISONE 20 MG PO TABS: 40.0000 mg | ORAL_TABLET | Freq: Every day | ORAL | 0 refills | Status: DC

## 2021-04-24 MED ORDER — METHYLPREDNISOLONE SODIUM SUCC 125 MG IJ SOLR
125.0000 mg | Freq: Once | INTRAMUSCULAR | Status: AC
  Administered 2021-04-24: 125 mg via INTRAMUSCULAR

## 2021-04-24 MED ORDER — METHYLPREDNISOLONE SODIUM SUCC 125 MG IJ SOLR
INTRAMUSCULAR | Status: AC
Start: 2021-04-24 — End: ?
  Filled 2021-04-24: qty 2

## 2021-04-24 NOTE — ED Triage Notes (Signed)
Pt c/o swelling, redness, and hives to both hands and lt foot. Denies known allergies. States took benadryl 3 hrs ago.

## 2021-04-24 NOTE — ED Provider Notes (Signed)
[place]   [id number] 04/24/21 Arrival Time: 1740  ASSESSMENT & PLAN:  1. Urticaria    No resp symptoms. Begin: Meds ordered this encounter  Medications   methylPREDNISolone sodium succinate (SOLU-MEDROL) 125 mg/2 mL injection 125 mg   predniSONE (DELTASONE) 20 MG tablet    Sig: Take 2 tablets (40 mg total) by mouth daily.    Dispense:  14 tablet    Refill:  0  Benadryl as needed.  Will follow up with PCP or here if worsening or failing to improve as anticipated. Reviewed expectations re: course of current medical issues. Questions answered. Outlined signs and symptoms indicating need for more acute intervention. Patient verbalized understanding. After Visit Summary given.   SUBJECTIVE:  Theresa Mann is a 51 y.o. female who presents with a skin complaint. Bilat arms; urticaria; h/o in past; unknown trigger; red itchy rash; abrupt onset today. Benadryl without much relief. No swallowing/resp difficulties.    OBJECTIVE: Vitals:   04/24/21 1821  BP: (!) 128/92  Pulse: 80  Resp: 18  Temp: 98.3 F (36.8 C)  TempSrc: Oral  SpO2: 97%    General appearance: alert; no distress HEENT: [place]; AT Neck: supple with FROM Lungs: speaks full sentences without difficulty Extremities: no edema; moves all extremities normally Skin: warm and dry; signs of infection: no; smooth, slightly elevated and erythematous plaques of variable size over her bilateral hand and forearms; hands are swollen Psychological: alert and cooperative; normal mood and affect  Allergies  Allergen Reactions   Clindamycin/Lincomycin Anaphylaxis   Penicillins Anaphylaxis    Has patient had a PCN reaction causing immediate rash, facial/tongue/throat swelling, SOB or lightheadedness with hypotension: Yes Has patient had a PCN reaction causing severe rash involving mucus membranes or skin necrosis: No Has patient had a PCN reaction that required hospitalization Yes Has patient had a PCN  reaction occurring within the last 10 years: Yes If all of the above answers are "NO", then may proceed with Cephalosporin use.   Milk-Related Compounds     Throat scratchy   Pollen Extract     Past Medical History:  Diagnosis Date   Allergic    Anemia    Asthma    seasoanl - rarely uses inhaler   Heart murmur    Hypothyroidism    SVD (spontaneous vaginal delivery)    x 2   Swelling 2017   LIPS, ARMS AND HAND    Thyroid disease    Social History   Socioeconomic History   Marital status: Single    Spouse name: Not on file   Number of children: Not on file   Years of education: Not on file   Highest education level: Not on file  Occupational History   Not on file  Tobacco Use   Smoking status: Never   Smokeless tobacco: Never  Vaping Use   Vaping Use: Never used  Substance and Sexual Activity   Alcohol use: Yes    Alcohol/week: 2.0 standard drinks    Types: 2 Glasses of wine per week   Drug use: No   Sexual activity: Yes    Birth control/protection: Surgical  Other Topics Concern   Not on file  Social History Narrative   Not on file   Social Determinants of Health   Financial Resource Strain: Not on file  Food Insecurity: Not on file  Transportation Needs: Not on file  Physical Activity: Not on file  Stress: Not on file  Social Connections: Not on file  Intimate Partner Violence: Not on file   Family History  Problem Relation Age of Onset   Breast cancer Maternal Grandmother    Past Surgical History:  Procedure Laterality Date   BREAST CYST ASPIRATION Left    BREAST SURGERY Right    benign cyst removed   CHOLECYSTECTOMY     DILATATION & CURETTAGE/HYSTEROSCOPY WITH MYOSURE N/A 11/08/2015   Procedure: DILATATION & CURETTAGE/HYSTEROSCOPY WITH MYOSURE;  Surgeon: Servando Salina, MD;  Location: [facility];  Service: Gynecology;  Laterality: N/A;   DILATATION & CURETTAGE/HYSTEROSCOPY WITH MYOSURE N/A 07/09/2017   Procedure: DILATATION &  CURETTAGE/HYSTEROSCOPY;  Surgeon: Servando Salina, MD;  Location: [facility];  Service: Gynecology;  Laterality: N/A;   HYSTEROSCOPY WITH NOVASURE N/A 07/09/2017   Procedure: HYSTEROSCOPY WITH NOVASURE;  Surgeon: Servando Salina, MD;  Location: [facility];  Service: Gynecology;  Laterality: N/A;   TUBAL LIGATION        Vanessa Kick, MD 04/24/21 [phone]

## 2021-05-09 ENCOUNTER — Other Ambulatory Visit: Payer: Managed Care, Other (non HMO)

## 2021-06-02 ENCOUNTER — Ambulatory Visit
Admission: RE | Admit: 2021-06-02 | Discharge: 2021-06-02 | Disposition: A | Discharge status: Home / Self Care | Payer: 59 | Source: Ambulatory Visit | Attending: Obstetrics and Gynecology | Admitting: Obstetrics and Gynecology

## 2021-06-02 ENCOUNTER — Ambulatory Visit: Payer: Managed Care, Other (non HMO)

## 2021-06-02 ENCOUNTER — Other Ambulatory Visit: Payer: Self-pay | Admitting: Obstetrics and Gynecology

## 2021-06-02 DIAGNOSIS — N6310 Unspecified lump in the right breast, unspecified quadrant: Secondary | ICD-10-CM

## 2021-06-18 ENCOUNTER — Encounter (HOSPITAL_COMMUNITY): Payer: Self-pay | Admitting: *Deleted

## 2021-06-18 ENCOUNTER — Ambulatory Visit (HOSPITAL_COMMUNITY)
Admission: EM | Admit: 2021-06-18 | Discharge: 2021-06-18 | Disposition: A | Discharge status: Home / Self Care | Payer: 59 | Attending: Nurse Practitioner | Admitting: Nurse Practitioner

## 2021-06-18 ENCOUNTER — Other Ambulatory Visit: Payer: Self-pay

## 2021-06-18 DIAGNOSIS — T7840XA Allergy, unspecified, initial encounter: Secondary | ICD-10-CM

## 2021-06-18 DIAGNOSIS — R220 Localized swelling, mass and lump, head: Secondary | ICD-10-CM | POA: Diagnosis not present

## 2021-06-18 MED ORDER — PREDNISONE 10 MG (21) PO TBPK: ORAL_TABLET | ORAL | 0 refills | Status: AC

## 2021-06-18 MED ORDER — FAMOTIDINE 20 MG PO TABS
20.0000 mg | ORAL_TABLET | Freq: Once | ORAL | Status: AC
  Administered 2021-06-18: 20 mg via ORAL

## 2021-06-18 MED ORDER — FAMOTIDINE 20 MG PO TABS
ORAL_TABLET | ORAL | Status: AC
Start: 2021-06-18 — End: ?
  Filled 2021-06-18: qty 1

## 2021-06-18 MED ORDER — METHYLPREDNISOLONE SODIUM SUCC 125 MG IJ SOLR
80.0000 mg | Freq: Once | INTRAMUSCULAR | Status: AC
  Administered 2021-06-18: 80 mg via INTRAMUSCULAR

## 2021-06-18 MED ORDER — METHYLPREDNISOLONE SODIUM SUCC 125 MG IJ SOLR
INTRAMUSCULAR | Status: AC
  Filled 2021-06-18: qty 2

## 2021-06-18 NOTE — ED Triage Notes (Signed)
Pt presents today with swelling to Lt lower lip. Pt reports this morning her Rt wrist and hand had hives and  swelling. Pt reports an hour ago Pt took '50mg'$  benadryl and 2 sudafed  ?

## 2021-06-18 NOTE — ED Provider Notes (Signed)
?[facility] ? ? ? ?CSN: [id number] ?Arrival date & time: 06/18/21  1912 ? ? ?  ? ?History   ?Chief Complaint ?Chief Complaint  ?Patient presents with  ? Allergic Reaction  ? ? ?HPI ?Theresa Mann is a 51 y.o. female.  ? ?Patient reports onset of lip swelling about 1 hour ago.  She reports history of frequent allergic reactions requiring EpiPen and hospitalization.  Also reports hives on her right wrist this morning that have now resolved.  She reports she took 2 Benadryl at home and she can feel it starting to work a little bit.  She reports her bottom lip is very swollen.  She denies any shortness of breath, wheezing, chest pain, chest tightness, throat or tongue swelling/itching.  She also denies any new medications, any bug bites, any new triggers or allergens.  She is planning to see an allergist in the next few months for recurrent allergic reactions. ? ? ?Past Medical History:  ?Diagnosis Date  ? Allergic   ? Anemia   ? Asthma   ? seasoanl - rarely uses inhaler  ? Heart murmur   ? Hypothyroidism   ? SVD (spontaneous vaginal delivery)   ? x 2  ? Swelling 2017  ? LIPS, ARMS AND HAND   ? Thyroid disease   ? ? ?Patient Active Problem List  ? Diagnosis Date Noted  ? Angioedema 06/07/2015  ? Pain in lower limb 03/13/2015  ? 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 Hallux limitus, acquired 03/13/2015  ? Bunion 03/13/2015  ? ? ?Past Surgical History:  ?Procedure Laterality Date  ? BREAST CYST ASPIRATION Left   ? BREAST SURGERY Right   ? benign cyst removed  ? CHOLECYSTECTOMY    ? DILATATION & CURETTAGE/HYSTEROSCOPY WITH MYOSURE N/A 11/08/2015  ? Procedure: [place];  Surgeon: Servando Salina, MD;  Location: [facility];  Service: Gynecology;  Laterality: N/A;  ? DILATATION & CURETTAGE/HYSTEROSCOPY WITH MYOSURE N/A 07/09/2017  ? Procedure: DILATATION & CURETTAGE/HYSTEROSCOPY;  Surgeon: Servando Salina, MD;  Location: [facility];  Service: Gynecology;  Laterality: N/A;  ?  HYSTEROSCOPY WITH NOVASURE N/A 07/09/2017  ? Procedure: HYSTEROSCOPY WITH NOVASURE;  Surgeon: Servando Salina, MD;  Location: [facility];  Service: Gynecology;  Laterality: N/A;  ? TUBAL LIGATION    ? ? ?OB History   ?No obstetric history on file. ?  ? ? ? ?Home Medications   ? ?Prior to Admission medications   ?Medication Sig Start Date End Date Taking? Authorizing Provider  ?predniSONE (STERAPRED UNI-PAK 21 TAB) 10 MG (21) TBPK tablet Take 6 tablets (60 mg total) by mouth daily for 1 day, THEN 5 tablets (50 mg total) daily for 1 day, THEN 4 tablets (40 mg total) daily for 1 day, THEN 3 tablets (30 mg total) daily for 1 day, THEN 2 tablets (20 mg total) daily for 1 day, THEN 1 tablet (10 mg total) daily for 1 day. 06/18/21 06/24/21 Yes Eulogio Bear, NP  ?albuterol (PROVENTIL HFA;VENTOLIN HFA) 108 (90 Base) MCG/ACT inhaler Inhale 2 puffs into the lungs every 6 (six) hours as needed for wheezing or shortness of breath.    [provider]  ?aspirin-sod bicarb-citric acid (ALKA-SELTZER) 325 MG TBEF tablet Take 650 mg by mouth every 6 (six) hours as needed (Indigestion).    [provider]  ?Atogepant (QULIPTA) 60 MG TABS TAKE 1 TABLET(60 MG) BY MOUTH DAILY 03/29/20   [provider]  ?gabapentin (NEURONTIN) 100 MG capsule Take 1 capsule (100  mg total) by mouth 3 (three) times daily. 09/21/20 10/21/20  Ward, Lenise Arena, PA-C  ?hydrochlorothiazide (HYDRODIURIL) 25 MG tablet Take 25 mg by mouth daily. 09/22/17   [provider]  ?ibuprofen (ADVIL,MOTRIN) 200 MG tablet Take 600 mg by mouth every 6 (six) hours as needed for fever, headache or moderate pain.    [provider]  ?ibuprofen (ADVIL,MOTRIN) 800 MG tablet Take 1 tablet (800 mg total) by mouth every 8 (eight) hours as needed. ?Patient not taking: Reported on 07/05/2017 11/08/15   Servando Salina, MD  ?levothyroxine (SYNTHROID, LEVOTHROID) 150 MCG tablet Take 150 mcg by mouth daily. 06/03/17   [provider]   ?loratadine (CLARITIN) 10 MG tablet Take 1 tablet (10 mg total) by mouth daily. ?Patient not taking: Reported on 10/31/2015 06/08/15   Cherene Altes, MD  ?[facility] 18 MG/3ML SOPN SMARTSIG:0.5 Milliliter(s) SUB-Q Daily 08/27/20   [provider]  ?SF 5000 PLUS 1.1 % CREA dental cream Place 1 application onto teeth daily. 08/17/17   [provider]  ? ? ?Family History ?Family History  ?Problem Relation Age of Onset  ? Breast cancer Maternal Grandmother   ? ? ?Social History ?Social History  ? ?Tobacco Use  ? Smoking status: Never  ? Smokeless tobacco: Never  ?Vaping Use  ? Vaping Use: Never used  ?Substance Use Topics  ? Alcohol use: Yes  ?  Alcohol/week: 2.0 standard drinks  ?  Types: 2 Glasses of wine per week  ? Drug use: No  ? ? ? ?Allergies   ?Clindamycin/lincomycin, Penicillins, Milk-related compounds, and Pollen extract ? ? ?Review of Systems ?Review of Systems ?Per HPI ? ?Physical Exam ?Triage Vital Signs ?ED Triage Vitals  ?Enc Vitals Group  ?   BP 06/18/21 1920 (!) 131/94  ?   Pulse Rate 06/18/21 1920 89  ?   Resp 06/18/21 1920 18  ?   Temp 06/18/21 1920 98.3 ?F (36.8 ?C)  ?   Temp src --   ?   SpO2 06/18/21 1920 99 %  ?   Weight --   ?   Height --   ?   Head Circumference --   ?   Peak Flow --   ?   Pain Score 06/18/21 1918 0  ?   Pain Loc --   ?   Pain Edu? --   ?   Excl. in [place]? --   ? ?No data found. ? ?Updated Vital Signs ?BP (!) 131/94   Pulse 89   Temp 98.3 ?F (36.8 ?C)   Resp 18   SpO2 99%  ? ?Visual Acuity ?Right Eye Distance:   ?Left Eye Distance:   ?Bilateral Distance:   ? ?Right Eye Near:   ?Left Eye Near:    ?Bilateral Near:    ? ?Physical Exam ?Vitals and nursing note reviewed.  ?Constitutional:   ?   General: She is not in acute distress. ?   Appearance: Normal appearance. She is not toxic-appearing.  ?HENT:  ?   Head: Normocephalic and atraumatic.  ?   Mouth/Throat:  ?   Mouth: Mucous membranes are moist. No oral lesions or angioedema.  ?   Pharynx: Oropharynx is clear.  Uvula midline. No pharyngeal swelling, oropharyngeal exudate, posterior oropharyngeal erythema or uvula swelling.  ?   Comments: Lower lip significantly edematous ?Pulmonary:  ?   Effort: Pulmonary effort is normal. No respiratory distress.  ?   Breath sounds: Normal breath sounds. No wheezing, rhonchi or rales.  ?  Skin: ?   General: Skin is warm and dry.  ?   Capillary Refill: Capillary refill takes less than 2 seconds.  ?   Coloration: Skin is not jaundiced or pale.  ?   Findings: No erythema.  ?Neurological:  ?   Mental Status: She is alert and oriented to person, place, and time.  ?   Motor: No weakness.  ?   Gait: Gait normal.  ?Psychiatric:     ?   Behavior: Behavior is cooperative.  ? ? ? ?UC Treatments / Results  ?Labs ?(all labs ordered are listed, but only abnormal results are displayed) ?Labs Reviewed - No data to display ? ?EKG ? ? ?Radiology ?No results found. ? ?Procedures ?Procedures (including critical care time) ? ?Medications Ordered in UC ?Medications  ?methylPREDNISolone sodium succinate (SOLU-MEDROL) 125 mg/2 mL injection 80 mg (80 mg Intramuscular Given 06/18/21 1943)  ?famotidine (PEPCID) tablet 20 mg (20 mg Oral Given 06/18/21 1942)  ? ? ?Initial Impression / Assessment and Plan / UC Course  ?I have reviewed the triage vital signs and the nursing notes. ? ?Pertinent labs & imaging results that were available during my care of the patient were reviewed by me and considered in my medical decision making (see chart for details). ? ?  ?Treat allergic reaction and lip swelling with Solu-Medrol 80 mg IM and famotidine 20 mg today in urgent care.  After observation, patient felt swelling of lip had gone down with this treatment.   ? ?Tomorrow, start Pepcid 20 mg twice daily as well as cetirizine 10 mg daily in the morning.  Also start prednisone taper pack.  She can continue to use 25 to 50 mg of Benadryl in the evening for itching or swelling.  Encouraged following up with Allergist.  If symptoms  persist or worsen despite this treatment, give EpiPen and call 911.  Patient verbalizes understanding and all questions answered.  She is in agreement to this plan. ?Final Clinical Impressions(s) / UC Diagnoses  ? ?

## 2021-06-18 NOTE — Discharge Instructions (Signed)
-   We have given you a steroid shot called Solu-Medrol 80 mg today as well as Pepcid 20 mg ?-Please take Pepcid 20 mg twice daily for the next several days ?-Please also start taking cetirizine or Zyrtec 10 mg daily in the morning; you can use Benadryl 25 to 50 mg in the evening for itching or swelling ?-Please also start the prednisone taper pack tomorrow ?-Please make an appointment with allergist as we discussed ?-Seek care if your symptoms worsen or persist ?

## 2021-10-10 ENCOUNTER — Ambulatory Visit
Admission: EM | Admit: 2021-10-10 | Discharge: 2021-10-10 | Disposition: A | Discharge status: Home / Self Care | Payer: 59 | Attending: Urgent Care | Admitting: Urgent Care

## 2021-10-10 ENCOUNTER — Encounter: Payer: Self-pay | Admitting: Emergency Medicine

## 2021-10-10 DIAGNOSIS — R220 Localized swelling, mass and lump, head: Secondary | ICD-10-CM | POA: Diagnosis not present

## 2021-10-10 DIAGNOSIS — T783XXA Angioneurotic edema, initial encounter: Secondary | ICD-10-CM

## 2021-10-10 MED ORDER — TRIAMCINOLONE ACETONIDE 40 MG/ML IJ SUSP
40.0000 mg | Freq: Once | INTRAMUSCULAR | Status: AC
  Administered 2021-10-10: 40 mg via INTRAMUSCULAR

## 2021-10-10 MED ORDER — HYDROXYZINE HCL 25 MG PO TABS
12.5000 mg | ORAL_TABLET | Freq: Three times a day (TID) | ORAL | 0 refills | Status: DC | PRN
Start: 2021-10-10 — End: 2023-01-27

## 2021-10-10 MED ORDER — EPINEPHRINE 0.3 MG/0.3ML IJ SOAJ: 0.3000 mg | INTRAMUSCULAR | 0 refills | Status: AC | PRN

## 2021-10-10 NOTE — ED Provider Notes (Signed)
[facility]   MRN: [id number] DOB: 10/15/1970  Subjective:   Theresa Mann is a 50 y.o. female presenting for 6-hour history of progressively worsening swelling about the face and tingling in her throat.  Patient reports she initially had the swelling over the left side, not to the right now.  The left side has subsided.  She has previously had difficulty with swelling, allergic reactions.  Has been to multiple allergist and has had allergy testing done.  She is supposed to carry an EpiPen with her but has not carried a consistently.  Denies eating any new foods, starting new medications, exposure to poisonous plants, new hygiene products, new cleaning products or detergents.  But she also admits that these types of reactions can happen very randomly.  Her chart does show that she has a history of angioedema.  Currently, patient does not feel any particular difficulty with her breathing.  No current facility-administered medications for this encounter.  Current Outpatient Medications:    albuterol (PROVENTIL HFA;VENTOLIN HFA) 108 (90 Base) MCG/ACT inhaler, Inhale 2 puffs into the lungs every 6 (six) hours as needed for wheezing or shortness of breath., Disp: , Rfl:    aspirin-sod bicarb-citric acid (ALKA-SELTZER) 325 MG TBEF tablet, Take 650 mg by mouth every 6 (six) hours as needed (Indigestion)., Disp: , Rfl:    Atogepant (QULIPTA) 60 MG TABS, TAKE 1 TABLET(60 MG) BY MOUTH DAILY, Disp: , Rfl:    gabapentin (NEURONTIN) 100 MG capsule, Take 1 capsule (100 mg total) by mouth 3 (three) times daily., Disp: 90 capsule, Rfl: 0   hydrochlorothiazide (HYDRODIURIL) 25 MG tablet, Take 25 mg by mouth daily., Disp: , Rfl: 1   ibuprofen (ADVIL,MOTRIN) 200 MG tablet, Take 600 mg by mouth every 6 (six) hours as needed for fever, headache or moderate pain., Disp: , Rfl:    ibuprofen (ADVIL,MOTRIN) 800 MG tablet, Take 1 tablet (800 mg total) by mouth every 8 (eight) hours as  needed. (Patient not taking: Reported on 07/05/2017), Disp: 30 tablet, Rfl: 4   levothyroxine (SYNTHROID, LEVOTHROID) 150 MCG tablet, Take 150 mcg by mouth daily., Disp: , Rfl: 1   loratadine (CLARITIN) 10 MG tablet, Take 1 tablet (10 mg total) by mouth daily. (Patient not taking: Reported on 10/31/2015), Disp: , Rfl:    SAXENDA 18 MG/3ML SOPN, SMARTSIG:0.5 Milliliter(s) SUB-Q Daily, Disp: , Rfl:    SF 5000 PLUS 1.1 % CREA dental cream, Place 1 application onto teeth daily., Disp: , Rfl: 3   Allergies  Allergen Reactions   Clindamycin/Lincomycin Anaphylaxis   Penicillins Anaphylaxis    Has patient had a PCN reaction causing immediate rash, facial/tongue/throat swelling, SOB or lightheadedness with hypotension: Yes Has patient had a PCN reaction causing severe rash involving mucus membranes or skin necrosis: No Has patient had a PCN reaction that required hospitalization Yes Has patient had a PCN reaction occurring within the last 10 years: Yes If all of the above answers are "NO", then may proceed with Cephalosporin use.   Milk-Related Compounds     Throat scratchy   Pollen Extract     Past Medical History:  Diagnosis Date   Allergic    Anemia    Asthma    seasoanl - rarely uses inhaler   Heart murmur    Hypothyroidism    SVD (spontaneous vaginal delivery)    x 2   Swelling 2017   LIPS, ARMS AND HAND    Thyroid disease  Past Surgical History:  Procedure Laterality Date   BREAST CYST ASPIRATION Left    BREAST SURGERY Right    benign cyst removed   CHOLECYSTECTOMY     DILATATION & CURETTAGE/HYSTEROSCOPY WITH MYOSURE N/A 11/08/2015   Procedure: DILATATION & CURETTAGE/HYSTEROSCOPY WITH MYOSURE;  Surgeon: Servando Salina, MD;  Location: [facility];  Service: Gynecology;  Laterality: N/A;   DILATATION & CURETTAGE/HYSTEROSCOPY WITH MYOSURE N/A 07/09/2017   Procedure: DILATATION & CURETTAGE/HYSTEROSCOPY;  Surgeon: Servando Salina, MD;  Location: [facility];  Service: Gynecology;   Laterality: N/A;   HYSTEROSCOPY WITH NOVASURE N/A 07/09/2017   Procedure: HYSTEROSCOPY WITH NOVASURE;  Surgeon: Servando Salina, MD;  Location: [facility];  Service: Gynecology;  Laterality: N/A;   TUBAL LIGATION      Family History  Problem Relation Age of Onset   Breast cancer Maternal Grandmother     Social History   Tobacco Use   Smoking status: Never   Smokeless tobacco: Never  Vaping Use   Vaping Use: Never used  Substance Use Topics   Alcohol use: Yes    Alcohol/week: 2.0 standard drinks of alcohol    Types: 2 Glasses of wine per week   Drug use: No    ROS   Objective:   Vitals: BP 110/79   Pulse 77   Temp 98 F (36.7 C)   Resp (!) 22   SpO2 98%   Physical Exam Constitutional:      General: She is not in acute distress.    Appearance: Normal appearance. She is well-developed. She is not ill-appearing, toxic-appearing or diaphoretic.  HENT:     Head: Normocephalic and atraumatic.     Nose: Nose normal.     Mouth/Throat:     Mouth: Mucous membranes are moist.     Pharynx: No pharyngeal swelling, oropharyngeal exudate, posterior oropharyngeal erythema or uvula swelling.     Tonsils: No tonsillar exudate or tonsillar abscesses. 0 on the right. 0 on the left.     Comments: 1+ swelling of the right lower lip extending to the right face to the jawline.  Airway is patent.  Patient is speaking in full sentences and controlling secretions. Eyes:     General: No scleral icterus.       Right eye: No discharge.        Left eye: No discharge.     Extraocular Movements: Extraocular movements intact.  Cardiovascular:     Rate and Rhythm: Normal rate and regular rhythm.     Heart sounds: Normal heart sounds. No murmur heard.    No friction rub. No gallop.  Pulmonary:     Effort: Pulmonary effort is normal. No respiratory distress.     Breath sounds: No stridor. No wheezing, rhonchi or rales.  Chest:     Chest wall: No tenderness.  Skin:    General: Skin is warm  and dry.  Neurological:     General: No focal deficit present.     Mental Status: She is alert and oriented to person, place, and time.  Psychiatric:        Mood and Affect: Mood normal.        Behavior: Behavior normal.    IM triamcinolone 40 mg in clinic.   Assessment and Plan :   PDMP not reviewed this encounter.  1. Angioedema, initial encounter   2. Facial swelling    IM steroid intervention as above.  I will not follow this with oral steroids as triamcinolone half-life is extensive.  Refilled  her EpiPen.  I do not see that it is appropriate to use at this time.  Also recommended hydroxyzine.  Follow-up with her allergist.  Counseled patient on potential for adverse effects with medications prescribed/recommended today, ER and return-to-clinic precautions discussed, patient verbalized understanding.    Jaynee Eagles, [place] 10/10/21 [id number]

## 2021-10-10 NOTE — ED Triage Notes (Signed)
Pt here with oral swelling and tingling in throat. Pt states this has happened before and has an epi pen but does not carry it with her. Has been coming on for 2 days, but is getting acutely worse in the last hour.

## 2022-01-21 ENCOUNTER — Encounter (HOSPITAL_COMMUNITY): Payer: Self-pay | Admitting: Emergency Medicine

## 2022-01-21 ENCOUNTER — Ambulatory Visit (HOSPITAL_COMMUNITY)
Admission: EM | Admit: 2022-01-21 | Discharge: 2022-01-21 | Disposition: A | Discharge status: Home / Self Care | Payer: 59

## 2022-01-21 ENCOUNTER — Other Ambulatory Visit: Payer: Self-pay

## 2022-01-21 DIAGNOSIS — T783XXA Angioneurotic edema, initial encounter: Secondary | ICD-10-CM | POA: Diagnosis not present

## 2022-01-21 DIAGNOSIS — R0602 Shortness of breath: Secondary | ICD-10-CM | POA: Diagnosis not present

## 2022-01-21 MED ORDER — METHYLPREDNISOLONE SODIUM SUCC 125 MG IJ SOLR
125.0000 mg | Freq: Once | INTRAMUSCULAR | Status: AC
  Administered 2022-01-21: 125 mg via INTRAMUSCULAR

## 2022-01-21 MED ORDER — METHYLPREDNISOLONE SODIUM SUCC 125 MG IJ SOLR
INTRAMUSCULAR | Status: AC
  Filled 2022-01-21: qty 2

## 2022-01-21 MED ORDER — PREDNISONE 10 MG (21) PO TBPK: ORAL_TABLET | Freq: Every day | ORAL | 0 refills | Status: DC

## 2022-01-21 NOTE — ED Triage Notes (Addendum)
Swelling lips on Sunday, resolved on its own.  Lips started swelling today around 4:30.  Patient had tongue swelling.  Tongue is normal now.  Had benadryl  around 3-4 pm today.  Patient has hives and itching.    Patient has had swelling 4 or 5 times this year.  Providers have performed allergy testing. Allergy testing was less than a month ago   Patient has right hand swelling and has nausea.  Chest has hurt all day per patient dull, throbbing chest pain.  This is new-this does not usually accompany the swelling

## 2022-01-21 NOTE — Discharge Instructions (Addendum)
Today you are being treated for allergic reaction with unknown trigger  Been given a steroid injection today here in office which ideally will begin to lessen your symptoms in about 30 minutes  EKG shows a normal rhythm and paced to your heart and is has pain is most likely result of allergic reaction and shortness of breath, should improve as reaction subsides  Starting tomorrow begin prednisone taper to continue to resolve symptoms  At any point if you begin to have throat swelling or tongue swelling with difficulty breathing please administer EpiPen and then go to the nearest emergency department for evaluation  Continue use of Benadryl every 6 hours for additional support

## 2022-01-22 NOTE — ED Provider Notes (Signed)
[facility]    CSN: [id number] Arrival date & time: 01/21/22  1827      History   Chief Complaint Chief Complaint  Patient presents with   Angioedema    HPI Theresa Mann is a 51 y.o. female.   Patient presents for evaluation of a severe allergic reaction beginning 3 days ago.  Unknown trigger, endorses that she has several occurrences of angioedema throughout the year, has evaluated by PCP and completed allergy testing.  Today she is experiencing upper lip swelling, shortness of breath, chest tightness and hives generalized to the body.  Felt as if she was experiencing tongue swelling earlier today but this has resolved.  Chest tightness has been constant and described as a dull throbbing sensation to the center, new symptom.  has been taking Benadryl for management with the last dosage approximately around 3 to 4 PM today.  Denies dizziness, lightheadedness, syncope, visual changes, wheezing.  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   Hallux limitus, acquired 03/13/2015   Bunion 03/13/2015    Past Surgical History:  Procedure Laterality Date   BREAST CYST ASPIRATION Left    BREAST SURGERY Right    benign cyst removed   CHOLECYSTECTOMY     DILATATION & CURETTAGE/HYSTEROSCOPY WITH MYOSURE N/A 11/08/2015   Procedure: [place];  Surgeon: Servando Salina, MD;  Location: [facility];  Service: Gynecology;  Laterality: N/A;   DILATATION & CURETTAGE/HYSTEROSCOPY WITH MYOSURE N/A 07/09/2017   Procedure: DILATATION & CURETTAGE/HYSTEROSCOPY;  Surgeon: Servando Salina, MD;   Location: [facility];  Service: Gynecology;  Laterality: N/A;   HYSTEROSCOPY WITH NOVASURE N/A 07/09/2017   Procedure: HYSTEROSCOPY WITH NOVASURE;  Surgeon: Servando Salina, MD;  Location: [facility];  Service: Gynecology;  Laterality: N/A;   TUBAL LIGATION      OB History   No obstetric history on file.      Home Medications    Prior to Admission medications   Medication Sig Start Date End Date Taking? Authorizing Provider  acetaZOLAMIDE ER (DIAMOX) 500 MG capsule Take 1 capsule by mouth 2 (two) times daily. 01/02/22  Yes [provider]  amitriptyline (ELAVIL) 25 MG tablet TAKE 1 TABLET BY MOUTH DAILY FOR 5 DAYS, THEN CAN INCREASE TO 2 TABLETS AT BEDTIME 12/02/21  Yes [provider]  predniSONE (STERAPRED UNI-PAK 21 TAB) 10 MG (21) TBPK tablet Take by mouth daily. Take 6 tabs by mouth daily  for 1 days, then 5 tabs for 1 days, then 4 tabs for 1 days, then 3 tabs for 1 days, 2 tabs for 1 days, then 1 tab by mouth daily for 1 days 01/21/22  Yes Hillary Schwegler R, NP  albuterol (PROVENTIL HFA;VENTOLIN HFA) 108 (90 Base) MCG/ACT inhaler Inhale 2 puffs into the lungs every 6 (six) hours as needed for wheezing or shortness of breath.    [provider]  aspirin-sod bicarb-citric acid (ALKA-SELTZER) 325 MG TBEF tablet Take 650 mg by mouth every 6 (six) hours as needed (Indigestion).  [provider]  Atogepant (QULIPTA) 60 MG TABS TAKE 1 TABLET(60 MG) BY MOUTH DAILY 03/29/20   [provider]  EPINEPHrine 0.3 mg/0.3 mL IJ SOAJ injection Inject 0.3 mg into the muscle as needed for anaphylaxis. 10/10/21   Jaynee Eagles, PA-C  gabapentin (NEURONTIN) 100 MG capsule Take 1 capsule (100 mg total) by mouth 3 (three) times daily. Patient not taking: Reported on 01/21/2022 09/21/20 10/21/20  Ward, Lenise Arena, PA-C  hydrochlorothiazide (HYDRODIURIL) 25 MG tablet Take 25 mg by mouth daily. Patient not taking: Reported on 01/21/2022 09/22/17   [provider]   hydrOXYzine (ATARAX) 25 MG tablet Take 0.5-1 tablets (12.5-25 mg total) by mouth every 8 (eight) hours as needed for itching. 10/10/21   Jaynee Eagles, PA-C  ibuprofen (ADVIL,MOTRIN) 200 MG tablet Take 600 mg by mouth every 6 (six) hours as needed for fever, headache or moderate pain.    [provider]  ibuprofen (ADVIL,MOTRIN) 800 MG tablet Take 1 tablet (800 mg total) by mouth every 8 (eight) hours as needed. Patient not taking: Reported on 07/05/2017 11/08/15   Servando Salina, MD  levothyroxine (SYNTHROID, LEVOTHROID) 150 MCG tablet Take 150 mcg by mouth daily. 06/03/17   [provider]  loratadine (CLARITIN) 10 MG tablet Take 1 tablet (10 mg total) by mouth daily. Patient not taking: Reported on 10/31/2015 06/08/15   Cherene Altes, MD  SAXENDA 18 MG/3ML SOPN SMARTSIG:0.5 Milliliter(s) SUB-Q Daily Patient not taking: Reported on 01/21/2022 08/27/20   [provider]  SF 5000 PLUS 1.1 % CREA dental cream Place 1 application onto teeth daily. 08/17/17   [provider]    Family History Family History  Problem Relation Age of Onset   Breast cancer Maternal Grandmother     Social History Social History   Tobacco Use   Smoking status: Never   Smokeless tobacco: Never  Vaping Use   Vaping Use: Never used  Substance Use Topics   Alcohol use: Yes    Alcohol/week: 2.0 standard drinks of alcohol    Types: 2 Glasses of wine per week   Drug use: No     Allergies   Clindamycin/lincomycin, Penicillins, Milk-related compounds, and Pollen extract   Review of Systems Review of Systems Defer to HPI    Physical Exam Triage Vital Signs ED Triage Vitals  Enc Vitals Group     BP 01/21/22 1946 127/84     Pulse Rate 01/21/22 1946 (!) 106     Resp 01/21/22 1946 (!) 22     Temp 01/21/22 1946 98.2 F (36.8 C)     Temp Source 01/21/22 1946 Oral     SpO2 01/21/22 1946 100 %     Weight --      Height --      Head Circumference --      Peak Flow --       Pain Score 01/21/22 1942 7     Pain Loc --      Pain Edu? --      Excl. in [place]? --    No data found.  Updated Vital Signs BP 127/84 (BP Location: Right Arm)   Pulse (!) 106   Temp 98.2 F (36.8 C) (Oral)   Resp (!) 22   SpO2 100%   Visual Acuity Right Eye Distance:   Left Eye Distance:   Bilateral Distance:    Right Eye Near:   Left Eye Near:    Bilateral Near:     Physical Exam Constitutional:  Appearance: Normal appearance.  HENT:     Head: Normocephalic.     Mouth/Throat:     Comments: Severe upper lip swelling, pharynx is clear without obstruction, uvula midline Eyes:     Extraocular Movements: Extraocular movements intact.  Cardiovascular:     Rate and Rhythm: Normal rate and regular rhythm.     Pulses: Normal pulses.     Heart sounds: Normal heart sounds.  Pulmonary:     Effort: Pulmonary effort is normal.     Breath sounds: Normal breath sounds.  Skin:    Comments: Hives generalized to the trunk, bilateral upper and lower extremities  Neurological:     Mental Status: She is alert and oriented to person, place, and time. Mental status is at baseline.  Psychiatric:        Mood and Affect: Mood normal.        Behavior: Behavior normal.      UC Treatments / Results  Labs (all labs ordered are listed, but only abnormal results are displayed) Labs Reviewed - No data to display  EKG   Radiology No results found.  Procedures Procedures (including critical care time)  Medications Ordered in UC Medications  methylPREDNISolone sodium succinate (SOLU-MEDROL) 125 mg/2 mL injection 125 mg (125 mg Intramuscular Given 01/21/22 2009)    Initial Impression / Assessment and Plan / UC Course  I have reviewed the triage vital signs and the nursing notes.  Pertinent labs & imaging results that were available during my care of the patient were reviewed by me and considered in my medical decision making (see chart for details).  Angioedema, initial  encounter  Vital signs stable, O2 saturation 100%, lungs are clear to auscultation and pharynx is clear, EKG showing sinus tach, pulse 106,  stable for urgent care management, methylprednisolone 125 mg injection given in office due to severity of reaction involving new symptoms, prednisone taper prescribed for outpatient, has epinephrine pen, instructed use if she begins to experience throat swelling or any forms of difficulty breathing and go to the nearest emergency department for immediate evaluation, verbalized understanding Final Clinical Impressions(s) / UC Diagnoses   Final diagnoses:  Angioedema, initial encounter     Discharge Instructions      Today you are being treated for allergic reaction with unknown trigger  Been given a steroid injection today here in office which ideally will begin to lessen your symptoms in about 30 minutes  EKG shows a normal rhythm and paced to your heart and is has pain is most likely result of allergic reaction and shortness of breath, should improve as reaction subsides  Starting tomorrow begin prednisone taper to continue to resolve symptoms  At any point if you begin to have throat swelling or tongue swelling with difficulty breathing please administer EpiPen and then go to the nearest emergency department for evaluation  Continue use of Benadryl every 6 hours for additional support   ED Prescriptions     Medication Sig Dispense Auth. Provider   predniSONE (STERAPRED UNI-PAK 21 TAB) 10 MG (21) TBPK tablet Take by mouth daily. Take 6 tabs by mouth daily  for 1 days, then 5 tabs for 1 days, then 4 tabs for 1 days, then 3 tabs for 1 days, 2 tabs for 1 days, then 1 tab by mouth daily for 1 days 21 tablet Amier Hoyt, Leitha Schuller, NP      PDMP not reviewed this encounter.   Hans Eden, [place] 01/22/22 [phone]

## 2022-07-08 ENCOUNTER — Ambulatory Visit
Admission: EM | Admit: 2022-07-08 | Discharge: 2022-07-08 | Disposition: A | Discharge status: Home / Self Care | Payer: 59 | Attending: Urgent Care | Admitting: Urgent Care

## 2022-07-08 DIAGNOSIS — J019 Acute sinusitis, unspecified: Secondary | ICD-10-CM

## 2022-07-08 DIAGNOSIS — L03213 Periorbital cellulitis: Secondary | ICD-10-CM | POA: Diagnosis not present

## 2022-07-08 MED ORDER — LEVOFLOXACIN 750 MG PO TABS: 750.0000 mg | ORAL_TABLET | Freq: Every day | ORAL | 0 refills | Status: DC

## 2022-07-08 MED ORDER — PSEUDOEPHEDRINE HCL 60 MG PO TABS: 60.0000 mg | ORAL_TABLET | Freq: Three times a day (TID) | ORAL | 0 refills | Status: AC | PRN

## 2022-07-08 NOTE — ED Triage Notes (Signed)
Pt reports redness swelling and watery in left eye x 2 weeks; nasal congestion, hoarse x 3 weeks.

## 2022-07-08 NOTE — ED Provider Notes (Signed)
[facility]  Note:  This document was prepared using Conservation officer, historic buildings and may include unintentional dictation errors.  MRN: [id number] DOB: 12-09-1970  Subjective:   Theresa Mann is a 52 y.o. female presenting for 3-week history of persistent sinus congestion, sinus drainage, hoarseness now having persistent difficulty of the left eye.  Has felt watering, in the past week has had more swelling and pain of the left upper eyelid.  Has allergies, takes Zyrtec for this.  Has also been using antibiotic eyedrops without any relief.  No current facility-administered medications for this encounter.  Current Outpatient Medications:    acetaZOLAMIDE ER (DIAMOX) 500 MG capsule, Take 1 capsule by mouth 2 (two) times daily., Disp: , Rfl:    albuterol (PROVENTIL HFA;VENTOLIN HFA) 108 (90 Base) MCG/ACT inhaler, Inhale 2 puffs into the lungs every 6 (six) hours as needed for wheezing or shortness of breath., Disp: , Rfl:    amitriptyline (ELAVIL) 25 MG tablet, TAKE 1 TABLET BY MOUTH DAILY FOR 5 DAYS, THEN CAN INCREASE TO 2 TABLETS AT BEDTIME, Disp: , Rfl:    aspirin-sod bicarb-citric acid (ALKA-SELTZER) 325 MG TBEF tablet, Take 650 mg by mouth every 6 (six) hours as needed (Indigestion)., Disp: , Rfl:    Atogepant (QULIPTA) 60 MG TABS, TAKE 1 TABLET(60 MG) BY MOUTH DAILY, Disp: , Rfl:    EPINEPHrine 0.3 mg/0.3 mL IJ SOAJ injection, Inject 0.3 mg into the muscle as needed for anaphylaxis., Disp: 1 each, Rfl: 0   gabapentin (NEURONTIN) 100 MG capsule, Take 1 capsule (100 mg total) by mouth 3 (three) times daily. (Patient not taking: Reported on 01/21/2022), Disp: 90 capsule, Rfl: 0   hydrochlorothiazide (HYDRODIURIL) 25 MG tablet, Take 25 mg by mouth daily. (Patient not taking: Reported on 01/21/2022), Disp: , Rfl: 1   hydrOXYzine (ATARAX) 25 MG tablet, Take 0.5-1 tablets (12.5-25 mg total) by mouth every 8 (eight) hours as needed for itching., Disp: 30 tablet,  Rfl: 0   ibuprofen (ADVIL,MOTRIN) 200 MG tablet, Take 600 mg by mouth every 6 (six) hours as needed for fever, headache or moderate pain., Disp: , Rfl:    ibuprofen (ADVIL,MOTRIN) 800 MG tablet, Take 1 tablet (800 mg total) by mouth every 8 (eight) hours as needed. (Patient not taking: Reported on 07/05/2017), Disp: 30 tablet, Rfl: 4   levothyroxine (SYNTHROID, LEVOTHROID) 150 MCG tablet, Take 150 mcg by mouth daily., Disp: , Rfl: 1   loratadine (CLARITIN) 10 MG tablet, Take 1 tablet (10 mg total) by mouth daily. (Patient not taking: Reported on 10/31/2015), Disp: , Rfl:    predniSONE (STERAPRED UNI-PAK 21 TAB) 10 MG (21) TBPK tablet, Take by mouth daily. Take 6 tabs by mouth daily  for 1 days, then 5 tabs for 1 days, then 4 tabs for 1 days, then 3 tabs for 1 days, 2 tabs for 1 days, then 1 tab by mouth daily for 1 days, Disp: 21 tablet, Rfl: 0   SAXENDA 18 MG/3ML SOPN, SMARTSIG:0.5 Milliliter(s) SUB-Q Daily (Patient not taking: Reported on 01/21/2022), Disp: , Rfl:    SF 5000 PLUS 1.1 % CREA dental cream, Place 1 application onto teeth daily., Disp: , Rfl: 3   Allergies  Allergen Reactions   Clindamycin/Lincomycin Anaphylaxis   Penicillins Anaphylaxis    Has patient had a PCN reaction causing immediate rash, facial/tongue/throat swelling, SOB or lightheadedness with hypotension: Yes Has patient had a PCN reaction causing severe rash involving mucus membranes or skin necrosis: No Has patient  had a PCN reaction that required hospitalization Yes Has patient had a PCN reaction occurring within the last 10 years: Yes If all of the above answers are "NO", then may proceed with Cephalosporin use.   Bee Pollen Rash   Milk-Related Compounds     Throat scratchy   Pollen Extract    Acetazolamide Other (See Comments)    lightheaded    Past Medical History:  Diagnosis Date   Allergic    Anemia    Asthma    seasoanl - rarely uses inhaler   Heart murmur    Hypothyroidism    SVD (spontaneous vaginal  delivery)    x 2   Swelling 2017   LIPS, ARMS AND HAND    Thyroid disease      Past Surgical History:  Procedure Laterality Date   BREAST CYST ASPIRATION Left    BREAST SURGERY Right    benign cyst removed   CHOLECYSTECTOMY     DILATATION & CURETTAGE/HYSTEROSCOPY WITH MYOSURE N/A 11/08/2015   Procedure: DILATATION & CURETTAGE/HYSTEROSCOPY WITH MYOSURE;  Surgeon: Maxie Better, MD;  Location: [facility];  Service: Gynecology;  Laterality: N/A;   DILATATION & CURETTAGE/HYSTEROSCOPY WITH MYOSURE N/A 07/09/2017   Procedure: DILATATION & CURETTAGE/HYSTEROSCOPY;  Surgeon: Maxie Better, MD;  Location: [facility];  Service: Gynecology;  Laterality: N/A;   HYSTEROSCOPY WITH NOVASURE N/A 07/09/2017   Procedure: HYSTEROSCOPY WITH NOVASURE;  Surgeon: Maxie Better, MD;  Location: [facility];  Service: Gynecology;  Laterality: N/A;   TUBAL LIGATION      Family History  Problem Relation Age of Onset   Breast cancer Maternal Grandmother     Social History   Tobacco Use   Smoking status: Never   Smokeless tobacco: Never  Vaping Use   Vaping Use: Never used  Substance Use Topics   Alcohol use: Yes    Alcohol/week: 2.0 standard drinks of alcohol    Types: 2 Glasses of wine per week   Drug use: No    ROS   Objective:   Vitals: BP 126/89 (BP Location: Left Arm)   Pulse 72   Temp 98.9 F (37.2 C) (Oral)   Resp 16   SpO2 94%   Physical Exam Constitutional:      General: She is not in acute distress.    Appearance: Normal appearance. She is well-developed and normal weight. She is not ill-appearing, toxic-appearing or diaphoretic.  HENT:     Head: Normocephalic and atraumatic.     Right Ear: Tympanic membrane, ear canal and external ear normal. No drainage or tenderness. No middle ear effusion. There is no impacted cerumen. Tympanic membrane is not erythematous or bulging.     Left Ear: Tympanic membrane, ear canal and external ear normal. No drainage or tenderness.  No middle  ear effusion. There is no impacted cerumen. Tympanic membrane is not erythematous or bulging.     Nose: Congestion and rhinorrhea present.     Mouth/Throat:     Mouth: Mucous membranes are moist. No oral lesions.     Pharynx: No pharyngeal swelling, oropharyngeal exudate, posterior oropharyngeal erythema or uvula swelling.     Tonsils: No tonsillar exudate or tonsillar abscesses.  Eyes:     General: Lids are everted, no foreign bodies appreciated. Vision grossly intact. No scleral icterus.       Right eye: No foreign body, discharge or hordeolum.        Left eye: No foreign body, discharge or hordeolum.     Extraocular Movements: Extraocular  movements intact.     Right eye: Normal extraocular motion.     Left eye: Normal extraocular motion and no nystagmus.     Conjunctiva/sclera: Conjunctivae normal.     Right eye: Right conjunctiva is not injected. No chemosis, exudate or hemorrhage.    Left eye: Left conjunctiva is not injected. No chemosis, exudate or hemorrhage.  Cardiovascular:     Rate and Rhythm: Normal rate and regular rhythm.     Heart sounds: Normal heart sounds. No murmur heard.    No friction rub. No gallop.  Pulmonary:     Effort: Pulmonary effort is normal. No respiratory distress.     Breath sounds: No stridor. No wheezing, rhonchi or rales.  Chest:     Chest wall: No tenderness.  Musculoskeletal:     Cervical back: Normal range of motion and neck supple.  Lymphadenopathy:     Cervical: No cervical adenopathy.  Skin:    General: Skin is warm and dry.  Neurological:     General: No focal deficit present.     Mental Status: She is alert and oriented to person, place, and time.  Psychiatric:        Mood and Affect: Mood normal.        Behavior: Behavior normal.     Assessment and Plan :   PDMP not reviewed this encounter.  1. Preseptal cellulitis of left upper eyelid   2. Acute sinusitis, recurrence not specified, unspecified location    Will start empiric  treatment for sinusitis complicated to preseptal cellulitis with levofloxacin given severe reaction with amoxicillin and penicillin of anaphylaxis.  Recommended supportive care otherwise. Counseled patient on potential for adverse effects with medications prescribed/recommended today, ER and return-to-clinic precautions discussed, patient verbalized understanding.    Wallis Bamberg, [place] 07/08/22 1931

## 2022-07-08 NOTE — Discharge Instructions (Signed)
We will manage this as a preseptal cellulitis and sinus infection with levofloxacin. For sore throat or cough try using a honey-based tea. Use 3 teaspoons of honey with juice squeezed from half lemon. Place shaved pieces of ginger into 1/2-1 cup of water and warm over stove top. Then mix the ingredients and repeat every 4 hours as needed. Please take ibuprofen 600mg  every 6 hours with food alternating with OR taken together with Tylenol 500mg -650mg  every 6 hours for throat pain, fevers, aches and pains. Hydrate very well with at least 2 liters of water. Eat light meals such as soups (chicken and noodles, vegetable, chicken and wild rice).  Do not eat foods that you are allergic to.  Taking an antihistamine like Zyrtec can help against postnasal drainage, sinus congestion which can cause sinus pain, sinus headaches, throat pain, painful swallowing, coughing.  You can take this together with pseudoephedrine (Sudafed) at a dose of 60 mg 3 times a day or twice daily as needed for the same kind of nasal drip, congestion.

## 2022-07-19 ENCOUNTER — Emergency Department (HOSPITAL_COMMUNITY): Payer: 59

## 2022-07-19 ENCOUNTER — Emergency Department (HOSPITAL_COMMUNITY)
Admission: EM | Admit: 2022-07-19 | Discharge: 2022-07-19 | Disposition: A | Discharge status: Home / Self Care | Payer: 59 | Attending: Emergency Medicine | Admitting: Emergency Medicine

## 2022-07-19 ENCOUNTER — Encounter (HOSPITAL_COMMUNITY): Payer: Self-pay | Admitting: Pharmacy Technician

## 2022-07-19 ENCOUNTER — Other Ambulatory Visit: Payer: Self-pay

## 2022-07-19 DIAGNOSIS — E039 Hypothyroidism, unspecified: Secondary | ICD-10-CM | POA: Insufficient documentation

## 2022-07-19 DIAGNOSIS — G932 Benign intracranial hypertension: Secondary | ICD-10-CM | POA: Diagnosis not present

## 2022-07-19 DIAGNOSIS — Z79899 Other long term (current) drug therapy: Secondary | ICD-10-CM | POA: Diagnosis not present

## 2022-07-19 DIAGNOSIS — J45909 Unspecified asthma, uncomplicated: Secondary | ICD-10-CM | POA: Diagnosis not present

## 2022-07-19 DIAGNOSIS — R55 Syncope and collapse: Secondary | ICD-10-CM | POA: Diagnosis present

## 2022-07-19 LAB — COMPREHENSIVE METABOLIC PANEL
ALT: 19 U/L (ref 0–44)
AST: 31 U/L (ref 15–41)
Albumin: 4 g/dL (ref 3.5–5.0)
Alkaline Phosphatase: 46 U/L (ref 38–126)
Anion gap: 13 (ref 5–15)
BUN: 7 mg/dL (ref 6–20)
CO2: 27 mmol/L (ref 22–32)
Calcium: 9.3 mg/dL (ref 8.9–10.3)
Chloride: 98 mmol/L (ref 98–111)
Creatinine, Ser: 1.55 mg/dL — ABNORMAL HIGH (ref 0.44–1.00)
GFR, Estimated: 40 mL/min — ABNORMAL LOW (ref >60)
Glucose, Bld: 77 mg/dL (ref 70–99)
Potassium: 3.1 mmol/L — ABNORMAL LOW (ref 3.5–5.1)
Sodium: 138 mmol/L (ref 135–145)
Total Bilirubin: 0.8 mg/dL (ref 0.3–1.2)
Total Protein: 6.6 g/dL (ref 6.5–8.1)

## 2022-07-19 LAB — CBC
HCT: 39.5 % (ref 36.0–46.0)
Hemoglobin: 12.8 g/dL (ref 12.0–15.0)
MCH: 30.8 pg (ref 26.0–34.0)
MCHC: 32.4 g/dL (ref 30.0–36.0)
MCV: 95.2 fL (ref 80.0–100.0)
Platelets: 171 10*3/uL (ref 150–400)
RBC: 4.15 MIL/uL (ref 3.87–5.11)
RDW: 14.8 % (ref 11.5–15.5)
WBC: 5.8 10*3/uL (ref 4.0–10.5)
nRBC: 0 % (ref 0.0–0.2)

## 2022-07-19 LAB — DIFFERENTIAL
Abs Immature Granulocytes: 0.02 10*3/uL (ref 0.00–0.07)
Basophils Absolute: 0.1 10*3/uL (ref 0.0–0.1)
Basophils Relative: 1 %
Eosinophils Absolute: 0.3 10*3/uL (ref 0.0–0.5)
Eosinophils Relative: 5 %
Immature Granulocytes: 0 %
Lymphocytes Relative: 29 %
Lymphs Abs: 1.7 10*3/uL (ref 0.7–4.0)
Monocytes Absolute: 0.3 10*3/uL (ref 0.1–1.0)
Monocytes Relative: 5 %
Neutro Abs: 3.5 10*3/uL (ref 1.7–7.7)
Neutrophils Relative %: 60 %

## 2022-07-19 LAB — I-STAT CHEM 8, ED
BUN: 7 mg/dL (ref 6–20)
Calcium, Ion: 1.16 mmol/L (ref 1.15–1.40)
Chloride: 100 mmol/L (ref 98–111)
Creatinine, Ser: 1.6 mg/dL — ABNORMAL HIGH (ref 0.44–1.00)
Glucose, Bld: 72 mg/dL (ref 70–99)
HCT: 37 % (ref 36.0–46.0)
Hemoglobin: 12.6 g/dL (ref 12.0–15.0)
Potassium: 3.3 mmol/L — ABNORMAL LOW (ref 3.5–5.1)
Sodium: 139 mmol/L (ref 135–145)
TCO2: 27 mmol/L (ref 22–32)

## 2022-07-19 LAB — RAPID URINE DRUG SCREEN, HOSP PERFORMED
Amphetamines: NOT DETECTED
Barbiturates: NOT DETECTED
Benzodiazepines: NOT DETECTED
Cocaine: NOT DETECTED
Opiates: NOT DETECTED
Tetrahydrocannabinol: NOT DETECTED

## 2022-07-19 LAB — PROTIME-INR
INR: 1.1 (ref 0.8–1.2)
Prothrombin Time: 14.6 seconds (ref 11.4–15.2)

## 2022-07-19 LAB — T4, FREE: Free T4: <0.25 ng/dL — ABNORMAL LOW (ref 0.61–1.12)

## 2022-07-19 LAB — I-STAT BETA HCG BLOOD, ED (MC, WL, AP ONLY): I-stat hCG, quantitative: <5 m[IU]/mL (ref <5)

## 2022-07-19 LAB — TSH: TSH: 138.351 u[IU]/mL — ABNORMAL HIGH (ref 0.350–4.500)

## 2022-07-19 LAB — APTT: aPTT: 33 seconds (ref 24–36)

## 2022-07-19 LAB — ETHANOL: Alcohol, Ethyl (B): <10 mg/dL (ref <10)

## 2022-07-19 MED ORDER — METOCLOPRAMIDE HCL 5 MG/ML IJ SOLN
10.0000 mg | Freq: Once | INTRAMUSCULAR | Status: AC
  Administered 2022-07-19: 10 mg via INTRAVENOUS
  Filled 2022-07-19: qty 2

## 2022-07-19 MED ORDER — IOHEXOL 350 MG/ML SOLN
75.0000 mL | Freq: Once | INTRAVENOUS | Status: AC | PRN
  Administered 2022-07-19: 75 mL via INTRAVENOUS

## 2022-07-19 MED ORDER — TOPIRAMATE 25 MG PO TABS: 12.5000 mg | ORAL_TABLET | Freq: Every day | ORAL | 2 refills | Status: DC

## 2022-07-19 MED ORDER — LEVOTHYROXINE SODIUM 125 MCG PO TABS: 125.0000 ug | ORAL_TABLET | Freq: Every day | ORAL | 3 refills | Status: AC

## 2022-07-19 NOTE — Care Management (Signed)
PCP placed on AVS 

## 2022-07-19 NOTE — Discharge Instructions (Signed)
Your prescriptions were sent to the pharmacy and you should start them today if possible if not then tomorrow.  If you start having more episodes of passing out, vomiting, weakness, confusion or other concerns return to the hospital.  Your thyroid today was very low because she had been off medication for 6 months and your intracranial hypertension is probably acting up because you have been off those medications as well.  Both of them were sent to your pharmacy.

## 2022-07-19 NOTE — ED Triage Notes (Signed)
Pt here with reports of having syncopal event approx 2 weeks ago. Since then has had blurry vision, stumbling when walking and increased falls along with headaches. Pt with hx of idiopathic intracranial hypertension. Pt spoke to neurologist who recommended pt come here for further workup.

## 2022-07-19 NOTE — ED Provider Notes (Addendum)
[place] EMERGENCY DEPARTMENT AT [facility] Provider Note   CSN: [id number] Arrival date & time: 07/19/22  0740     History  Chief Complaint  Patient presents with   Loss of Consciousness    Theresa Mann is a 52 y.o. female.  Patient is a 52 year old female with a history of hypothyroidism, asthma, anemia, intracranial hypertension, chronic headaches who is presenting today with multiple complaints.  Patient reports that 3 weeks ago her son found her in the middle of the night on the floor.  She cannot recall what happened if she had a syncopal event if she felt bad she does not remember anything but reports since that time she has not been herself.  She has had waxing and waning headaches, feeling shaky and weak, having more trouble writing and now it is becoming illegible with shaking of her hands.  At times she also feels like she is losing her words and since 3 weeks ago she has had 2 falls.  She has been walking and reports her legs just went out.  She has had no fever, nausea, vomiting, abdominal pain, chest pain, shortness of breath.  She has had no other episodes of being unconscious.  She does admit that she has been off of her intracranial hypertension medication for the last 4 to 5 months because she has been working a lot and was not able to follow-up with her neurologist in [place].  She did call her neurologist after these things started but has not been able to get an appointment and they recommended she come to the emergency room.  She denies any confusion, drugs, alcohol or tobacco use.  She has not initiated any new medications.  The history is provided by the patient and medical records.  Loss of Consciousness      Home Medications Prior to Admission medications   Medication Sig Start Date End Date Taking? Authorizing Provider  acetaZOLAMIDE ER (DIAMOX) 500 MG capsule Take 1 capsule by mouth 2 (two) times daily. 01/02/22   [provider]   albuterol (PROVENTIL HFA;VENTOLIN HFA) 108 (90 Base) MCG/ACT inhaler Inhale 2 puffs into the lungs every 6 (six) hours as needed for wheezing or shortness of breath.    [provider]  amitriptyline (ELAVIL) 25 MG tablet TAKE 1 TABLET BY MOUTH DAILY FOR 5 DAYS, THEN CAN INCREASE TO 2 TABLETS AT BEDTIME 12/02/21   [provider]  aspirin-sod bicarb-citric acid (ALKA-SELTZER) 325 MG TBEF tablet Take 650 mg by mouth every 6 (six) hours as needed (Indigestion).    [provider]  Atogepant (QULIPTA) 60 MG TABS TAKE 1 TABLET(60 MG) BY MOUTH DAILY 03/29/20   [provider]  EPINEPHrine 0.3 mg/0.3 mL IJ SOAJ injection Inject 0.3 mg into the muscle as needed for anaphylaxis. 10/10/21   Wallis Bamberg, PA-C  gabapentin (NEURONTIN) 100 MG capsule Take 1 capsule (100 mg total) by mouth 3 (three) times daily. Patient not taking: Reported on 01/21/2022 09/21/20 10/21/20  Ward, Tylene Fantasia, PA-C  hydrochlorothiazide (HYDRODIURIL) 25 MG tablet Take 25 mg by mouth daily. Patient not taking: Reported on 01/21/2022 09/22/17   [provider]  hydrOXYzine (ATARAX) 25 MG tablet Take 0.5-1 tablets (12.5-25 mg total) by mouth every 8 (eight) hours as needed for itching. 10/10/21   Wallis Bamberg, PA-C  ibuprofen (ADVIL,MOTRIN) 200 MG tablet Take 600 mg by mouth every 6 (six) hours as needed for fever, headache or moderate pain.    [provider]  ibuprofen (ADVIL,MOTRIN) 800 MG tablet Take 1 tablet (800 mg total) by mouth every 8 (eight) hours as needed. Patient not taking: Reported on 07/05/2017 11/08/15   Maxie Better, MD  levofloxacin (LEVAQUIN) 750 MG tablet Take 1 tablet (750 mg total) by mouth daily. 07/08/22   Wallis Bamberg, PA-C  levothyroxine (SYNTHROID, LEVOTHROID) 150 MCG tablet Take 150 mcg by mouth daily. 06/03/17   [provider]  loratadine (CLARITIN) 10 MG tablet Take 1 tablet (10 mg total) by mouth daily. Patient not taking: Reported on 10/31/2015 06/08/15    Lonia Blood, MD  predniSONE (STERAPRED UNI-PAK 21 TAB) 10 MG (21) TBPK tablet Take by mouth daily. Take 6 tabs by mouth daily  for 1 days, then 5 tabs for 1 days, then 4 tabs for 1 days, then 3 tabs for 1 days, 2 tabs for 1 days, then 1 tab by mouth daily for 1 days 01/21/22   Valinda Hoar, NP  pseudoephedrine (SUDAFED) 60 MG tablet Take 1 tablet (60 mg total) by mouth every 8 (eight) hours as needed for congestion. 07/08/22   Wallis Bamberg, PA-C  SAXENDA 18 MG/3ML SOPN SMARTSIG:0.5 Milliliter(s) SUB-Q Daily Patient not taking: Reported on 01/21/2022 08/27/20   [provider]  SF 5000 PLUS 1.1 % CREA dental cream Place 1 application onto teeth daily. 08/17/17   [provider]      Allergies    Clindamycin/lincomycin, Penicillins, Bee pollen, Milk-related compounds, Pollen extract, and Acetazolamide    Review of Systems   Review of Systems  Cardiovascular:  Positive for syncope.    Physical Exam Updated Vital Signs BP 112/84   Pulse 61   Temp 97.6 F (36.4 C) (Oral)   Resp 16   SpO2 100%  Physical Exam Vitals and nursing note reviewed.  Constitutional:      General: She is not in acute distress.    Appearance: She is well-developed.  HENT:     Head: Normocephalic and atraumatic.  Eyes:     General: No visual field deficit.    Extraocular Movements: Extraocular movements intact.     Pupils: Pupils are equal, round, and reactive to light.     Comments: Unable to clearly visualize the optic disc  Cardiovascular:     Rate and Rhythm: Normal rate and regular rhythm.     Heart sounds: Normal heart sounds. No murmur heard.    No friction rub.  Pulmonary:     Effort: Pulmonary effort is normal.     Breath sounds: Normal breath sounds. No wheezing or rales.  Abdominal:     General: Bowel sounds are normal. There is no distension.     Palpations: Abdomen is soft.     Tenderness: There is no abdominal tenderness. There is no guarding or rebound.   Musculoskeletal:        General: No tenderness. Normal range of motion.     Comments: No edema  Skin:    General: Skin is warm and dry.     Findings: No rash.  Neurological:     Mental Status: She is alert and oriented to person, place, and time.     Cranial Nerves: No cranial nerve deficit, dysarthria or facial asymmetry.     Sensory: Sensation is intact.     Motor: Motor function is intact. No weakness or pronator drift.     Coordination: Finger-Nose-Finger Test and Heel to Garrison Test normal.     Gait: Gait is intact.     Comments:  Patient has fine tremor of the hands and feet with active movements but is able to complete the task has no pronator drift.  No notable visual field cuts or dysarthria  Psychiatric:        Mood and Affect: Mood normal.        Behavior: Behavior normal.     ED Results / Procedures / Treatments   Labs (all labs ordered are listed, but only abnormal results are displayed) Labs Reviewed  COMPREHENSIVE METABOLIC PANEL - Abnormal; Notable for the following components:      Result Value   Potassium 3.1 (*)    Creatinine, Ser 1.55 (*)    GFR, Estimated 40 (*)    All other components within normal limits  TSH - Abnormal; Notable for the following components:   TSH 138.351 (*)    All other components within normal limits  T4, FREE - Abnormal; Notable for the following components:   Free T4 <0.25 (*)    All other components within normal limits  I-STAT CHEM 8, ED - Abnormal; Notable for the following components:   Potassium 3.3 (*)    Creatinine, Ser 1.60 (*)    All other components within normal limits  ETHANOL  PROTIME-INR  APTT  CBC  DIFFERENTIAL  RAPID URINE DRUG SCREEN, HOSP PERFORMED  I-STAT BETA HCG BLOOD, ED ([facility], [facility], [facility] ONLY)    EKG EKG Interpretation  Date/Time:  Sunday Jul 19 2022 07:50:53 EDT Ventricular Rate:  90 PR Interval:  146 QRS Duration: 97 QT Interval:  414 QTC Calculation: 507 R Axis:   42 Text Interpretation: Sinus  rhythm Anterior infarct, old No significant change since last tracing Confirmed by Gwyneth Sprout ([id number]) on 07/19/2022 8:01:18 AM  Radiology CT ANGIO HEAD NECK W WO CM (CODE STROKE)  Result Date: 07/19/2022 CLINICAL DATA:  Provided history: Neuro deficit, acute, stroke suspected. EXAM: CT ANGIOGRAPHY HEAD AND NECK WITH AND WITHOUT CONTRAST TECHNIQUE: Multidetector CT imaging of the head and neck was performed using the standard protocol during bolus administration of intravenous contrast. Multiplanar CT image reconstructions and MIPs were obtained to evaluate the vascular anatomy. Carotid stenosis measurements (when applicable) are obtained utilizing NASCET criteria, using the distal internal carotid diameter as the denominator. RADIATION DOSE REDUCTION: This exam was performed according to the departmental dose-optimization program which includes automated exposure control, adjustment of the mA and/or kV according to patient size and/or use of iterative reconstruction technique. CONTRAST:  75mL OMNIPAQUE IOHEXOL 350 MG/ML SOLN COMPARISON:  Head CT 03/24/2020.  Brain MRI 04/11/2020. FINDINGS: CT HEAD FINDINGS Brain: Cerebral volume is normal. Partially empty sella turcica. There is no acute intracranial hemorrhage. No demarcated cortical infarct. No extra-axial fluid collection. No evidence of an intracranial mass. No midline shift. Vascular: No hyperdense vessel. Skull: No fracture or aggressive osseous lesion. Sinuses/Orbits: No orbital mass or acute orbital finding. Minimal mucosal thickening within the inferior frontal and anterior ethmoid air cells on the left. Small mucous retention cyst within the left sphenoid sinus. No evidence of an acute intracranial abnormality. These results were called by telephone at the time of interpretation on 07/19/2022 at 11:46 am to provider [facility] , who verbally acknowledged these results. Review of the MIP images confirms the above findings CTA NECK FINDINGS  Aortic arch: Standard aortic branching. The visualized thoracic aorta is normal in caliber. Streak and beam hardening artifact arising from a dense right-sided contrast bolus partially obscures the right subclavian artery. Within this limitation, there is no appreciable hemodynamically significant  innominate or proximal subclavian artery stenosis. Right carotid system: CCA and ICA patent within the neck without stenosis or significant atherosclerotic disease. Left carotid system: CCA and ICA patent within the neck without stenosis or significant atherosclerotic disease Vertebral arteries: Vertebral arteries codominant and patent within the neck without stenosis or significant atherosclerotic disease. Skeleton: No acute fracture or aggressive osseous lesion. Other neck: No neck mass or cervical lymphadenopathy. Diminutive thyroid gland. Upper chest: No consolidation within the imaged lung apices. Review of the MIP images confirms the above findings CTA HEAD FINDINGS Anterior circulation: The intracranial internal carotid arteries are patent. The M1 middle cerebral arteries are patent. No M2 proximal branch occlusion or high-grade proximal stenosis. The anterior cerebral arteries are patent. No intracranial aneurysm is identified. Posterior circulation: The intracranial vertebral arteries are patent. The basilar artery is patent. The posterior cerebral arteries are patent. A small left posterior communicating artery is present. The right posterior communicating artery is diminutive or absent. Venous sinuses: Within the limitations of contrast timing, no convincing thrombus. Anatomic variants: As described Review of the MIP images confirms the above findings IMPRESSION: Non-contrast head CT: 1. No evidence of acute intracranial hemorrhage or acute infarct. 2. Partially empty sella turcica. This finding can reflect incidental anatomic variation, or alternatively, it can be associated with idiopathic intracranial  hypertension (pseudotumor cerebri). CTA neck: The common carotid, internal carotid and vertebral arteries are patent within the neck without stenosis or significant atherosclerotic disease. CTA head: No intracranial large vessel occlusion or proximal high-grade arterial stenosis identified. Electronically Signed   By: Jackey Loge D.O.   On: 07/19/2022 11:56   CT Head Wo Contrast  Result Date: 07/19/2022 CLINICAL DATA:  Provided history: Neuro deficit, acute, stroke suspected. EXAM: CT ANGIOGRAPHY HEAD AND NECK WITH AND WITHOUT CONTRAST TECHNIQUE: Multidetector CT imaging of the head and neck was performed using the standard protocol during bolus administration of intravenous contrast. Multiplanar CT image reconstructions and MIPs were obtained to evaluate the vascular anatomy. Carotid stenosis measurements (when applicable) are obtained utilizing NASCET criteria, using the distal internal carotid diameter as the denominator. RADIATION DOSE REDUCTION: This exam was performed according to the departmental dose-optimization program which includes automated exposure control, adjustment of the mA and/or kV according to patient size and/or use of iterative reconstruction technique. CONTRAST:  75mL OMNIPAQUE IOHEXOL 350 MG/ML SOLN COMPARISON:  Head CT 03/24/2020.  Brain MRI 04/11/2020. FINDINGS: CT HEAD FINDINGS Brain: Cerebral volume is normal. Partially empty sella turcica. There is no acute intracranial hemorrhage. No demarcated cortical infarct. No extra-axial fluid collection. No evidence of an intracranial mass. No midline shift. Vascular: No hyperdense vessel. Skull: No fracture or aggressive osseous lesion. Sinuses/Orbits: No orbital mass or acute orbital finding. Minimal mucosal thickening within the inferior frontal and anterior ethmoid air cells on the left. Small mucous retention cyst within the left sphenoid sinus. No evidence of an acute intracranial abnormality. These results were called by telephone at  the time of interpretation on 07/19/2022 at 11:46 am to provider [facility] , who verbally acknowledged these results. Review of the MIP images confirms the above findings CTA NECK FINDINGS Aortic arch: Standard aortic branching. The visualized thoracic aorta is normal in caliber. Streak and beam hardening artifact arising from a dense right-sided contrast bolus partially obscures the right subclavian artery. Within this limitation, there is no appreciable hemodynamically significant innominate or proximal subclavian artery stenosis. Right carotid system: CCA and ICA patent within the neck without stenosis or significant atherosclerotic disease. Left carotid  system: CCA and ICA patent within the neck without stenosis or significant atherosclerotic disease Vertebral arteries: Vertebral arteries codominant and patent within the neck without stenosis or significant atherosclerotic disease. Skeleton: No acute fracture or aggressive osseous lesion. Other neck: No neck mass or cervical lymphadenopathy. Diminutive thyroid gland. Upper chest: No consolidation within the imaged lung apices. Review of the MIP images confirms the above findings CTA HEAD FINDINGS Anterior circulation: The intracranial internal carotid arteries are patent. The M1 middle cerebral arteries are patent. No M2 proximal branch occlusion or high-grade proximal stenosis. The anterior cerebral arteries are patent. No intracranial aneurysm is identified. Posterior circulation: The intracranial vertebral arteries are patent. The basilar artery is patent. The posterior cerebral arteries are patent. A small left posterior communicating artery is present. The right posterior communicating artery is diminutive or absent. Venous sinuses: Within the limitations of contrast timing, no convincing thrombus. Anatomic variants: As described Review of the MIP images confirms the above findings IMPRESSION: Non-contrast head CT: 1. No evidence of acute intracranial  hemorrhage or acute infarct. 2. Partially empty sella turcica. This finding can reflect incidental anatomic variation, or alternatively, it can be associated with idiopathic intracranial hypertension (pseudotumor cerebri). CTA neck: The common carotid, internal carotid and vertebral arteries are patent within the neck without stenosis or significant atherosclerotic disease. CTA head: No intracranial large vessel occlusion or proximal high-grade arterial stenosis identified. Electronically Signed   By: Jackey Loge D.O.   On: 07/19/2022 11:56    Procedures Procedures    Medications Ordered in ED Medications  metoCLOPramide (REGLAN) injection 10 mg (10 mg Intravenous Given 07/19/22 0950)  iohexol (OMNIPAQUE) 350 MG/ML injection 75 mL (75 mLs Intravenous Contrast Given 07/19/22 1137)    ED Course/ Medical Decision Making/ A&P                             Medical Decision Making Amount and/or Complexity of Data Reviewed External Data Reviewed: notes. Labs: ordered. Decision-making details documented in ED Course. Radiology: ordered and independent interpretation performed. Decision-making details documented in ED Course. ECG/medicine tests: ordered and independent interpretation performed. Decision-making details documented in ED Course.  Risk Prescription drug management.   Pt with multiple medical problems and comorbidities and presenting today with a complaint that caries a high risk for morbidity and mortality.  Presenting today with multiple complaints.  Concern for exacerbation of her intracranial hypertension which she has not been under treatment for the last 4 to 5 months versus stroke versus bleed from fall versus electrolyte abnormalities.  Low suspicion for elevated ammonia as patient does not have history of liver disease has no asterixis and denies alcohol use.  No signs of infectious etiology and low suspicion for sepsis.  CT/CTA of the head and neck is pending.  Patient may need  MRI in the future.  Low suspicion for an acute cardiac cause for her symptoms.  I independently interpreted patient's EKG which shows no acute findings.  She has had no chest pain or shortness of breath.  She is hemodynamically stable, low risk [place] with low suspicion for PE and no findings to suggest dissection.  Patient is having a headache and was given a headache cocktail as she undergoes evaluation.  2:25 PM I independently interpreted patient's labs and EKG.  EKG without acute findings, labs with negative UDS, negative hCG, CBC within normal limits, CMP with creatinine of 1.55 mild hypokalemia of 3.1, TSH today elevated at 138  and T4 is undetectable.  When speaking with patient she reports she did follow-up with her PCP but does not think they checked her thyroid and she has not had Synthroid for at least 6 months.  I have independently visualized and interpreted pt's images today.  Head CT today without acute findings and CTA without evidence of blockage.  Radiology reports no acute findings, partially empty sella turcica which can be present with IIH but no other acute findings.  Spoke with Dr. Iver Nestle with neurology who reports it would be okay to start her back on her Topamax which she had been on prior.  She does not need to be on both that and Diamox and based on neurology records patient did not tolerate Diamox in the past.  Will dose according to her creatinine clearance 53.  Also spoke with internal medicine about recommendations and will restart Synthroid at the baseline dose since she has been off for 6 months.  Confirm this with pharmacy on the correct dosage and will do 1.37mcg/kg/day.  All of this was discussed with the patient.  At this time she is not showing signs of myxedema coma she is awake and alert and feel that she is stable for discharge home but needs close follow-up.  She is requesting TOC help in finding local physicians.  Neurology referral was placed for [facility] because her  neurologist is in [place].  She was given prescriptions for her medications.  She was given return precautions.         Final Clinical Impression(s) / ED Diagnoses Final diagnoses:  IIH (idiopathic intracranial hypertension)  Hypothyroidism, unspecified type    Rx / DC Orders ED Discharge Orders     None         Gwyneth Sprout, MD 07/19/22 1425    Gwyneth Sprout, MD 07/19/22 1500

## 2022-11-18 ENCOUNTER — Telehealth: Payer: Self-pay | Admitting: Psychiatry

## 2022-11-18 ENCOUNTER — Ambulatory Visit: Payer: Self-pay | Admitting: Psychiatry

## 2022-11-18 ENCOUNTER — Encounter: Payer: Self-pay | Admitting: Psychiatry

## 2022-11-18 NOTE — Telephone Encounter (Signed)
Pt LVM to reschedule appt due to work schedule conflict. Called LVM for pt to call the office to reschedule appt.

## 2022-11-18 NOTE — Progress Notes (Deleted)
Referring:  Gwyneth Sprout, MD [address] [place] [employer],  [place] [address]  PCP: Center, [facility]  Neurology was asked to evaluate Theresa Mann, a 52 year old female for a chief complaint of headaches.  Our recommendations of care will be communicated by shared medical record.    CC:  headaches  History provided from ***  HPI:  Medical co-morbidities: hypothyroidism, asthma, IIH  The patient presents for evaluation of headaches which began***. 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 She was initially started on Diamox, but was unable to tolerate the side effects so this was switched to Topamax.  She presented to the ED in May for a syncopal episode. TSH at that time was 138. CTH showed a partially empty sella and CTA head/neck was unremarkable.  Headache History: Onset: Triggers: Aura: Location: Quality/Description: Associated Symptoms:  Photophobia:  Phonophobia:  Nausea: Vomiting: Allodynia: Other symptoms: Worse with activity?: Duration of headaches:  Headache days per month: *** Migraine days per month: *** Headache free days per month: ***  Current Treatment: Abortive ***  Preventative ***  Prior Therapies                                 Diamox 250 mg - side effects Topamax 200 mg BID gabapentin Qulipta 60 mg daily Maxalt 10 mg PRN Ubrelvy Phenergan 25 mg PRN Baclofen 10 mg PRN Nerivio   LABS: ***  IMAGING:  ***  ***Imaging independently reviewed on November 18, 2022   Current Outpatient Medications on File Prior to Visit  Medication Sig Dispense Refill   acetaZOLAMIDE ER (DIAMOX) 500 MG capsule Take 1 capsule by mouth 2 (two) times daily.     albuterol (PROVENTIL HFA;VENTOLIN HFA) 108 (90 Base) MCG/ACT inhaler Inhale 2 puffs into the lungs every 6 (six) hours as needed for wheezing or shortness of breath.     amitriptyline (ELAVIL) 25  MG tablet TAKE 1 TABLET BY MOUTH DAILY FOR 5 DAYS, THEN CAN INCREASE TO 2 TABLETS AT BEDTIME     aspirin-sod bicarb-citric acid (ALKA-SELTZER) 325 MG TBEF tablet Take 650 mg by mouth every 6 (six) hours as needed (Indigestion).     Atogepant (QULIPTA) 60 MG TABS TAKE 1 TABLET(60 MG) BY MOUTH DAILY     EPINEPHrine 0.3 mg/0.3 mL IJ SOAJ injection Inject 0.3 mg into the muscle as needed for anaphylaxis. 1 each 0   gabapentin (NEURONTIN) 100 MG capsule Take 1 capsule (100 mg total) by mouth 3 (three) times daily. (Patient not taking: Reported on 01/21/2022) 90 capsule 0   hydrochlorothiazide (HYDRODIURIL) 25 MG tablet Take 25 mg by mouth daily. (Patient not taking: Reported on 01/21/2022)  1   hydrOXYzine (ATARAX) 25 MG tablet Take 0.5-1 tablets (12.5-25 mg total) by mouth every 8 (eight) hours as needed for itching. 30 tablet 0   ibuprofen (ADVIL,MOTRIN) 200 MG tablet Take 600 mg by mouth every 6 (six) hours as needed for fever, headache or moderate pain.     ibuprofen (ADVIL,MOTRIN) 800 MG tablet Take 1 tablet (800 mg total) by mouth every 8 (eight) hours as needed. (Patient not taking: Reported on 07/05/2017) 30 tablet 4   levofloxacin (LEVAQUIN) 750 MG tablet Take 1 tablet (750 mg total) by mouth daily. 7 tablet 0   levothyroxine (SYNTHROID) 125 MCG tablet Take 1 tablet (125 mcg total)  by mouth daily before breakfast. 30 tablet 3   loratadine (CLARITIN) 10 MG tablet Take 1 tablet (10 mg total) by mouth daily. (Patient not taking: Reported on 10/31/2015)     predniSONE (STERAPRED UNI-PAK 21 TAB) 10 MG (21) TBPK tablet Take by mouth daily. Take 6 tabs by mouth daily  for 1 days, then 5 tabs for 1 days, then 4 tabs for 1 days, then 3 tabs for 1 days, 2 tabs for 1 days, then 1 tab by mouth daily for 1 days 21 tablet 0   pseudoephedrine (SUDAFED) 60 MG tablet Take 1 tablet (60 mg total) by mouth every 8 (eight) hours as needed for congestion. 30 tablet 0   SAXENDA 18 MG/3ML SOPN SMARTSIG:0.5 Milliliter(s)  SUB-Q Daily (Patient not taking: Reported on 01/21/2022)     SF 5000 PLUS 1.1 % CREA dental cream Place 1 application onto teeth daily.  3   topiramate (TOPAMAX) 25 MG tablet Take 0.5 tablets (12.5 mg total) by mouth daily. 30 tablet 2   No current facility-administered medications on file prior to visit.     Allergies: Allergies  Allergen Reactions   Clindamycin/Lincomycin Anaphylaxis   Penicillins Anaphylaxis    Has patient had a PCN reaction causing immediate rash, facial/tongue/throat swelling, SOB or lightheadedness with hypotension: Yes Has patient had a PCN reaction causing severe rash involving mucus membranes or skin necrosis: No Has patient had a PCN reaction that required hospitalization Yes Has patient had a PCN reaction occurring within the last 10 years: Yes If all of the above answers are "NO", then may proceed with Cephalosporin use.   Bee Pollen Rash   Milk-Related Compounds     Throat scratchy   Pollen Extract    Acetazolamide Other (See Comments)    lightheaded    Family History: Migraine or other headaches in the family:  *** Aneurysms in a first degree relative:  *** Brain tumors in the family:  *** Other neurological illness in the family:   ***  Past Medical History: Past Medical History:  Diagnosis Date   Allergic    Anemia    Asthma    seasoanl - rarely uses inhaler   Heart murmur    Hypothyroidism    SVD (spontaneous vaginal delivery)    x 2   Swelling 2017   LIPS, ARMS AND HAND    Thyroid disease     Past Surgical History Past Surgical History:  Procedure Laterality Date   BREAST CYST ASPIRATION Left    BREAST SURGERY Right    benign cyst removed   CHOLECYSTECTOMY     DILATATION & CURETTAGE/HYSTEROSCOPY WITH MYOSURE N/A 11/08/2015   Procedure: DILATATION & CURETTAGE/HYSTEROSCOPY WITH MYOSURE;  Surgeon: Maxie Better, MD;  Location: [facility];  Service: Gynecology;  Laterality: N/A;   DILATATION & CURETTAGE/HYSTEROSCOPY WITH MYOSURE  N/A 07/09/2017   Procedure: DILATATION & CURETTAGE/HYSTEROSCOPY;  Surgeon: Maxie Better, MD;  Location: [facility];  Service: Gynecology;  Laterality: N/A;   HYSTEROSCOPY WITH NOVASURE N/A 07/09/2017   Procedure: HYSTEROSCOPY WITH NOVASURE;  Surgeon: Maxie Better, MD;  Location: [facility];  Service: Gynecology;  Laterality: N/A;   TUBAL LIGATION      Social History: Social History   Tobacco Use   Smoking status: Never   Smokeless tobacco: Never  Vaping Use   Vaping status: Never Used  Substance Use Topics   Alcohol use: Yes    Alcohol/week: 2.0 standard drinks of alcohol    Types: 2 Glasses of wine per week  Drug use: No   ***  ROS: Negative for fevers, chills. Positive for***. All other systems reviewed and negative unless stated otherwise in HPI.   Physical Exam:   Vital Signs: There were no vitals taken for this visit. GENERAL: well appearing,in no acute distress,alert SKIN:  Color, texture, turgor normal. No rashes or lesions HEAD:  Normocephalic/atraumatic. CV:  RRR RESP: Normal respiratory effort MSK: no tenderness to palpation over occiput, neck, or shoulders  NEUROLOGICAL: Mental Status: Alert, oriented to person, place and time,Follows commands Cranial Nerves: PERRL, visual fields intact to confrontation, extraocular movements intact, facial sensation intact, no facial droop or ptosis, hearing grossly intact, no dysarthria, palate elevate symmetrically, tongue protrudes midline, shoulder shrug intact and symmetric Motor: muscle strength 5/5 both upper and lower extremities,no drift, normal tone Reflexes: 2+ throughout Sensation: intact to light touch all 4 extremities Coordination: Finger-to- nose-finger intact bilaterally Gait: normal-based   IMPRESSION: ***  PLAN: ***   I spent a total of *** minutes chart reviewing and counseling the patient. Headache education was done. Discussed treatment options including preventive and acute medications,  natural supplements, and physical therapy. Discussed medication overuse headache and to limit use of acute treatments to no more than 2 days/week or 10 days/month. Discussed medication side effects, adverse reactions and drug interactions. Written educational materials and patient instructions outlining all of the above were given.  Follow-up: ***   Ocie Doyne, MD 11/18/2022   8:27 AM

## 2022-11-23 ENCOUNTER — Ambulatory Visit: Payer: Self-pay | Admitting: Psychiatry

## 2022-12-02 NOTE — Progress Notes (Unsigned)
Referring:  Gwyneth Sprout, MD [address] [place],  [place] [address]  PCP: Center, [facility]  Neurology was asked to evaluate Theresa Mann, a 52 year old female for a chief complaint of headaches.  Our recommendations of care will be communicated by shared medical record.    CC:  headaches  History provided from self  HPI:  Medical co-morbidities: hypothyroidism, asthma, IIH, migraines  The patient presents for evaluation of IIH and migraines. 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 Had another LP in 2021 with an opening pressure of 26. She was initially started on Diamox, but was unable to tolerate the side effects so this was switched to Topamax. She was also taking Qulipta and Nurtec for migraines. She stopped these medications around January 2024 as she was unable to follow up with her neurologist in [facility].  She presented to the ED in May for a syncopal episode. TSH at that time was 138. CTH showed a partially empty sella and CTA head/neck was unremarkable. She was started on Topamax 12.5 mg daily as her creatinine at the time was 1.6.  Since that time she has had daily headaches. They are associated with photophobia, phonophobia, and nausea.  Rarely has pulsatile tinnitus. Vision has been stable without any blurry vision or double vision. Last saw ophthalmology last year, and exam at that time was negative for papilledema.  Headache History: Onset: May 2024 Aura: none Location: retro-orbital, neck Quality/Description: pressure Associated Symptoms:  Photophobia: yes  Phonophobia: yes  Nausea: yes Worse with activity?: yes Duration of headaches: constant, fluctuates in severity  Headache days per month: 30 Headache free days per month: 0  Current Treatment: Abortive BC powder  Preventative Topamax 12.5 mg daily  Prior Therapies                                  Diamox 250 mg - side effects Topamax 200 mg BID gabapentin Qulipta 60 mg daily Maxalt 10 mg PRN Ubrelvy Nurtec 75 mg PRN Phenergan 25 mg PRN Baclofen 10 mg PRN Nerivio   LABS: CBC    Component Value Date/Time   WBC 5.8 07/19/2022 0933   RBC 4.15 07/19/2022 0933   HGB 12.6 07/19/2022 0959   HCT 37.0 07/19/2022 0959   PLT 171 07/19/2022 0933   MCV 95.2 07/19/2022 0933   MCH 30.8 07/19/2022 0933   MCHC 32.4 07/19/2022 0933   RDW 14.8 07/19/2022 0933   LYMPHSABS 1.7 07/19/2022 0933   MONOABS 0.3 07/19/2022 0933   EOSABS 0.3 07/19/2022 0933   BASOSABS 0.1 07/19/2022 0933      Latest Ref Rng & Units 07/19/2022    9:59 AM 07/19/2022    9:33 AM 10/02/2017    9:12 AM  CMP  Glucose 70 - 99 mg/dL 72  77  85   BUN 6 - 20 mg/dL 7  7  11    Creatinine 0.44 - 1.00 mg/dL 2.72  5.36  6.44   Sodium 135 - 145 mmol/L 139  138  137   Potassium 3.5 - 5.1 mmol/L 3.3  3.1  3.5   Chloride 98 - 111 mmol/L 100  98  100   CO2 22 - 32 mmol/L  27  27   Calcium 8.9 - 10.3 mg/dL  9.3  9.1   Total Protein 6.5 -  8.1 g/dL  6.6  7.7   Total Bilirubin 0.3 - 1.2 mg/dL  0.8  0.6   Alkaline Phos 38 - 126 U/L  46  168   AST 15 - 41 U/L  31  42   ALT 0 - 44 U/L  19  61      IMAGING:  CTH 07/19/22: 1. No evidence of acute intracranial hemorrhage or acute infarct. 2. Partially empty sella turcica. This finding can reflect incidental anatomic variation, or alternatively, it can be associated with idiopathic intracranial hypertension (pseudotumor cerebri).  Imaging independently reviewed on December 03, 2022   Current Outpatient Medications on File Prior to Visit  Medication Sig Dispense Refill   albuterol (PROVENTIL HFA;VENTOLIN HFA) 108 (90 Base) MCG/ACT inhaler Inhale 2 puffs into the lungs every 6 (six) hours as needed for wheezing or shortness of breath.     aspirin-sod bicarb-citric acid (ALKA-SELTZER) 325 MG TBEF tablet Take 650 mg by mouth every 6 (six) hours as needed (Indigestion).      EPINEPHrine 0.3 mg/0.3 mL IJ SOAJ injection Inject 0.3 mg into the muscle as needed for anaphylaxis. 1 each 0   ibuprofen (ADVIL,MOTRIN) 200 MG tablet Take 600 mg by mouth every 6 (six) hours as needed for fever, headache or moderate pain.     levothyroxine (SYNTHROID) 125 MCG tablet Take 1 tablet (125 mcg total) by mouth daily before breakfast. (Patient taking differently: Take 137 mcg by mouth daily before breakfast.) 30 tablet 3   pseudoephedrine (SUDAFED) 60 MG tablet Take 1 tablet (60 mg total) by mouth every 8 (eight) hours as needed for congestion. 30 tablet 0   amitriptyline (ELAVIL) 25 MG tablet TAKE 1 TABLET BY MOUTH DAILY FOR 5 DAYS, THEN CAN INCREASE TO 2 TABLETS AT BEDTIME (Patient not taking: Reported on 12/03/2022)     gabapentin (NEURONTIN) 100 MG capsule Take 1 capsule (100 mg total) by mouth 3 (three) times daily. (Patient not taking: Reported on 01/21/2022) 90 capsule 0   hydrochlorothiazide (HYDRODIURIL) 25 MG tablet Take 25 mg by mouth daily. (Patient not taking: Reported on 01/21/2022)  1   hydrOXYzine (ATARAX) 25 MG tablet Take 0.5-1 tablets (12.5-25 mg total) by mouth every 8 (eight) hours as needed for itching. (Patient not taking: Reported on 12/03/2022) 30 tablet 0   ibuprofen (ADVIL,MOTRIN) 800 MG tablet Take 1 tablet (800 mg total) by mouth every 8 (eight) hours as needed. (Patient not taking: Reported on 07/05/2017) 30 tablet 4   levofloxacin (LEVAQUIN) 750 MG tablet Take 1 tablet (750 mg total) by mouth daily. (Patient not taking: Reported on 12/03/2022) 7 tablet 0   loratadine (CLARITIN) 10 MG tablet Take 1 tablet (10 mg total) by mouth daily. (Patient not taking: Reported on 10/31/2015)     predniSONE (STERAPRED UNI-PAK 21 TAB) 10 MG (21) TBPK tablet Take by mouth daily. Take 6 tabs by mouth daily  for 1 days, then 5 tabs for 1 days, then 4 tabs for 1 days, then 3 tabs for 1 days, 2 tabs for 1 days, then 1 tab by mouth daily for 1 days (Patient not taking: Reported on  12/03/2022) 21 tablet 0   SAXENDA 18 MG/3ML SOPN SMARTSIG:0.5 Milliliter(s) SUB-Q Daily (Patient not taking: Reported on 01/21/2022)     SF 5000 PLUS 1.1 % CREA dental cream Place 1 application onto teeth daily. (Patient not taking: Reported on 12/03/2022)  3   No current facility-administered medications on file prior to visit.     Allergies: Allergies  Allergen Reactions   Clindamycin/Lincomycin Anaphylaxis   Penicillins Anaphylaxis    Has patient had a PCN reaction causing immediate rash, facial/tongue/throat swelling, SOB or lightheadedness with hypotension: Yes Has patient had a PCN reaction causing severe rash involving mucus membranes or skin necrosis: No Has patient had a PCN reaction that required hospitalization Yes Has patient had a PCN reaction occurring within the last 10 years: Yes If all of the above answers are "NO", then may proceed with Cephalosporin use.   Bee Pollen Rash   Milk-Related Compounds     Throat scratchy   Pollen Extract    Acetazolamide Other (See Comments)    lightheaded    Family History: Family History  Problem Relation Age of Onset   Breast cancer Maternal Grandmother      Past Medical History: Past Medical History:  Diagnosis Date   Allergic    Anemia    Asthma    seasoanl - rarely uses inhaler   Heart murmur    Hypothyroidism    SVD (spontaneous vaginal delivery)    x 2   Swelling 2017   LIPS, ARMS AND HAND    Thyroid disease     Past Surgical History Past Surgical History:  Procedure Laterality Date   BREAST CYST ASPIRATION Left    BREAST SURGERY Right    benign cyst removed   CHOLECYSTECTOMY     DILATATION & CURETTAGE/HYSTEROSCOPY WITH MYOSURE N/A 11/08/2015   Procedure: DILATATION & CURETTAGE/HYSTEROSCOPY WITH MYOSURE;  Surgeon: Maxie Better, MD;  Location: [facility];  Service: Gynecology;  Laterality: N/A;   DILATATION & CURETTAGE/HYSTEROSCOPY WITH MYOSURE N/A 07/09/2017   Procedure: DILATATION & CURETTAGE/HYSTEROSCOPY;   Surgeon: Maxie Better, MD;  Location: [facility];  Service: Gynecology;  Laterality: N/A;   HYSTEROSCOPY WITH NOVASURE N/A 07/09/2017   Procedure: HYSTEROSCOPY WITH NOVASURE;  Surgeon: Maxie Better, MD;  Location: [facility];  Service: Gynecology;  Laterality: N/A;   TUBAL LIGATION      Social History: Social History   Tobacco Use   Smoking status: Never   Smokeless tobacco: Never  Vaping Use   Vaping status: Never Used  Substance Use Topics   Alcohol use: Yes    Alcohol/week: 2.0 standard drinks of alcohol    Types: 2 Glasses of wine per week   Drug use: No    ROS: Negative for fevers, chills. Positive for headaches. All other systems reviewed and negative unless stated otherwise in HPI.   Physical Exam:   Vital Signs: BP 124/87 (BP Location: Right Arm, Patient Position: Sitting, Cuff Size: Normal)   Pulse 72   Ht 5\' 8"  (1.727 m)   Wt 195 lb 6.4 oz (88.6 kg)   BMI 29.71 kg/m  GENERAL: well appearing,in no acute distress,alert SKIN:  Color, texture, turgor normal. No rashes or lesions HEAD:  Normocephalic/atraumatic. CV:  RRR RESP: Normal respiratory effort  NEUROLOGICAL: Mental Status: Alert, oriented to person, place and time,Follows commands Cranial Nerves: PERRL, no papilledema visualized, visual fields intact to confrontation, extraocular movements intact, facial sensation intact, no facial droop or ptosis, hearing grossly intact, no dysarthria Motor: muscle strength 5/5 both upper and lower extremities,no drift, normal tone Reflexes: 2+ throughout Sensation: intact to light touch all 4 extremities Coordination: Finger-to- nose-finger intact bilaterally Gait: normal-based   IMPRESSION: 52 year old female with a history of hypothyroidism and asthma who presents for evaluation of IIH and migraines. Headaches were previously well-controlled with Topamax, Qulipta, and Nurtec. However she has developed daily headaches since stopping these  medications. Will  restart Qulipta and Nurtec, and increase Topamax to 50 mg BID. May uptitrate Topamax further if needed (Cr 09/09/22 was 1.01). No clear papilledema visualized today. Will have her follow up with her ophthalmologist as her last exam was one year ago.  PLAN: -Prevention: Increase Topamax to 50 mg at bedtime x1 week, then increase to 50 mg BID. May uptitrate further if needed based on CrCl. Restart Qulipta 60 mg daily -Rescue: Restart Nurtec 75 mg PRN -She will follow up with her ophthalmologist for optic nerve exam   I spent a total of 27 minutes chart reviewing and counseling the patient. Headache education was done. Discussed treatment options including preventive and acute medications. Discussed medication side effects, adverse reactions and drug interactions. Written educational materials and patient instructions outlining all of the above were given.  Follow-up: 6 months   Ocie Doyne, MD 12/03/2022   11:54 AM

## 2022-12-03 ENCOUNTER — Encounter: Payer: Self-pay | Admitting: Psychiatry

## 2022-12-03 ENCOUNTER — Ambulatory Visit: Payer: Managed Care, Other (non HMO) | Admitting: Psychiatry

## 2022-12-03 VITALS — BP 124/87 | HR 72 | Ht 68.0 in | Wt 195.4 lb

## 2022-12-03 DIAGNOSIS — G43719 Chronic migraine without aura, intractable, without status migrainosus: Secondary | ICD-10-CM | POA: Diagnosis not present

## 2022-12-03 DIAGNOSIS — G932 Benign intracranial hypertension: Secondary | ICD-10-CM | POA: Diagnosis not present

## 2022-12-03 MED ORDER — QULIPTA 60 MG PO TABS: 60.0000 mg | ORAL_TABLET | Freq: Every day | ORAL | 6 refills | Status: AC

## 2022-12-03 MED ORDER — NURTEC 75 MG PO TBDP
75.0000 mg | ORAL_TABLET | ORAL | 6 refills | Status: DC | PRN
Start: 2022-12-03 — End: 2023-02-11

## 2022-12-03 MED ORDER — TOPIRAMATE 50 MG PO TABS
ORAL_TABLET | ORAL | 6 refills | Status: DC
Start: 2022-12-03 — End: 2023-02-11

## 2022-12-22 ENCOUNTER — Other Ambulatory Visit (HOSPITAL_COMMUNITY): Payer: Self-pay

## 2022-12-22 ENCOUNTER — Telehealth: Payer: Self-pay

## 2022-12-22 NOTE — Telephone Encounter (Signed)
*  [facility]  Pharmacy Patient Advocate Encounter  Received notification from  [facility]  that Prior Authorization for Nurtec 75MG  dispersible tablets  has been APPROVED from 12/22/2022 to 12/22/2023. Ran test claim, Copay is $0.00. This test claim was processed through [facility]- copay amounts may vary at other pharmacies due to pharmacy/plan contracts, or as the patient moves through the different stages of their insurance plan.   PA #/Case ID/Reference #: [id number]

## 2022-12-22 NOTE — Telephone Encounter (Signed)
*  [facility]  Pharmacy Patient Advocate Encounter  Received notification from  [facility]  that Prior Authorization for Qulipta 60MG  tablets  has been APPROVED from 12/22/2022 to 06/20/2023. Ran test claim, Copay is $0.00. This test claim was processed through [facility]- copay amounts may vary at other pharmacies due to pharmacy/plan contracts, or as the patient moves through the different stages of their insurance plan.   PA #/Case ID/Reference #: [id number]

## 2023-01-11 ENCOUNTER — Encounter: Payer: Self-pay | Admitting: Podiatry

## 2023-01-11 ENCOUNTER — Ambulatory Visit (INDEPENDENT_AMBULATORY_CARE_PROVIDER_SITE_OTHER): Payer: Managed Care, Other (non HMO) | Admitting: Podiatry

## 2023-01-11 ENCOUNTER — Ambulatory Visit (INDEPENDENT_AMBULATORY_CARE_PROVIDER_SITE_OTHER): Payer: Managed Care, Other (non HMO)

## 2023-01-11 DIAGNOSIS — M205X2 Other deformities of toe(s) (acquired), left foot: Secondary | ICD-10-CM

## 2023-01-11 DIAGNOSIS — M79672 Pain in left foot: Secondary | ICD-10-CM

## 2023-01-11 MED ORDER — MELOXICAM 15 MG PO TABS
15.0000 mg | ORAL_TABLET | Freq: Every day | ORAL | 0 refills | Status: DC
Start: 2023-01-11 — End: 2023-02-08

## 2023-01-11 NOTE — Progress Notes (Signed)
  Subjective:  Patient ID: Theresa Mann, female    DOB: [birth year],   MRN: [id number]  No chief complaint on file.   52 y.o. female presents for concern of left foot pain that has been ongoing for a couple  months. Relates the pain is mostly at the base of her toe. Relates no matter what shoes she wears she gets pain. Relates the pain will come and go and the more she is on it the more painful  . Denies any other pedal complaints. Denies n/v/f/c.   Past Medical History:  Diagnosis Date   Allergic    Anemia    Asthma    seasoanl - rarely uses inhaler   Heart murmur    Hypothyroidism    SVD (spontaneous vaginal delivery)    x 2   Swelling 2017   LIPS, ARMS AND HAND    Thyroid disease     Objective:  Physical Exam: Vascular: DP/PT pulses 2/4 bilateral. CFT <3 seconds. Normal hair growth on digits. No edema.  Skin. No lacerations or abrasions bilateral feet.  Musculoskeletal: MMT 5/5 bilateral lower extremities in DF, PF, Inversion and Eversion. Deceased ROM in DF of ankle joint. Tender to the dorsum of the first MPJ. Pain with ROM of the first MPJ mostly on end ROM. Some pain over the hallux ipj. No pain with ROM of the IPJ.  Neurological: Sensation intact to light touch.   Assessment:   1. Hallux limitus, left      Plan:  Patient was evaluated and treated and all questions answered. -Xrays reviewed. No acute fractures or dislocations. Does have decreased joint space at first MPJ. Dorsal spurring noted with degenerative changes.   -Discussed hallux limitus and  treatement options; conservative and  Surgical management; risks, benefits, alternatives discussed. All patient's questions answered. -Rx Meloxicam provided. Most recent Cr was elevated a 1.55 so will only do for one month.  -Patient offered injection in the future if needed.   -Recommend continue with good supportive shoes and inserts. Discussed stiff soled shoes and use of carbon fiber foot plate.   -Patient to  return to office as needed or sooner if condition worsens.   Louann Sjogren, DPM

## 2023-01-26 ENCOUNTER — Encounter (HOSPITAL_COMMUNITY): Payer: Self-pay | Admitting: *Deleted

## 2023-01-26 ENCOUNTER — Ambulatory Visit (HOSPITAL_COMMUNITY)
Admission: EM | Admit: 2023-01-26 | Discharge: 2023-01-26 | Disposition: A | Discharge status: Home / Self Care | Payer: Managed Care, Other (non HMO)

## 2023-01-26 DIAGNOSIS — R519 Headache, unspecified: Secondary | ICD-10-CM | POA: Diagnosis not present

## 2023-01-26 DIAGNOSIS — J3089 Other allergic rhinitis: Secondary | ICD-10-CM | POA: Diagnosis not present

## 2023-01-26 NOTE — Discharge Instructions (Signed)
Go to the ER right now for further work up

## 2023-01-26 NOTE — ED Provider Notes (Signed)
[facility]    CSN: [id number] Arrival date & time: 01/26/23  1821      History   Chief Complaint Chief Complaint  Patient presents with   Back Pain   Headache   Nasal Congestion    HPI Sanika Marsalis is a 52 y.o. female who presents with HA all over her head x 2 days. She tried to see her neurologist while it was mild 2 weeks ago but they could not see her. Edwena Bunde had persistent rhinitis for the past 2-3 weeks ago, Was given 2 steroid shots 2 weeks ago, and last week was placed on taper prednisone but did not help her rhinitis. She has hardly slept due to the persistent HA which is present on her R temple > L and top of her head feels like she is getting stabbed. 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 It is the worst HA she has felt in a while. Denies vision changes. Has felt mild nausea. Denies dizziness. Nothing makes or worse or better.       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   Hallux limitus, acquired 03/13/2015   Bunion 03/13/2015    Past Surgical History:  Procedure Laterality Date   BREAST CYST ASPIRATION Left    BREAST SURGERY Right    benign cyst removed   CHOLECYSTECTOMY     DILATATION & CURETTAGE/HYSTEROSCOPY WITH MYOSURE N/A 11/08/2015   Procedure: DILATATION & CURETTAGE/HYSTEROSCOPY WITH MYOSURE;  Surgeon: Maxie Better, MD;  Location: [facility];  Service: Gynecology;  Laterality: N/A;   DILATATION & CURETTAGE/HYSTEROSCOPY WITH MYOSURE N/A 07/09/2017    Procedure: DILATATION & CURETTAGE/HYSTEROSCOPY;  Surgeon: Maxie Better, MD;  Location: [facility];  Service: Gynecology;  Laterality: N/A;   HYSTEROSCOPY WITH NOVASURE N/A 07/09/2017   Procedure: HYSTEROSCOPY WITH NOVASURE;  Surgeon: Maxie Better, MD;  Location: [facility];  Service: Gynecology;  Laterality: N/A;   TUBAL LIGATION      OB History   No obstetric history on file.      Home Medications    Prior to Admission medications   Medication Sig Start Date End Date Taking? Authorizing Provider  Atogepant (QULIPTA) 60 MG TABS Take 1 tablet (60 mg total) by mouth daily. 12/03/22  Yes Ocie Doyne, MD  ibuprofen (ADVIL,MOTRIN) 200 MG tablet Take 600 mg by mouth every 6 (six) hours as needed for fever, headache or moderate pain.   Yes [provider]  ibuprofen (ADVIL,MOTRIN) 800 MG tablet Take 1 tablet (800 mg total) by mouth every 8 (eight) hours as needed. 11/08/15  Yes Maxie Better, MD  levothyroxine (SYNTHROID) 125 MCG tablet Take 1 tablet (125 mcg total) by mouth daily before breakfast. Patient taking differently: Take 137 mcg by mouth daily before breakfast. 07/19/22  Yes Plunkett, Alphonzo Lemmings, MD  meloxicam (MOBIC) 15 MG tablet Take 1 tablet (15 mg total) by  mouth daily. 01/11/23  Yes Louann Sjogren, DPM  Rimegepant Sulfate (NURTEC) 75 MG TBDP Take 1 tablet (75 mg total) by mouth as needed. 12/03/22  Yes Ocie Doyne, MD  rizatriptan (MAXALT) 10 MG tablet Take by mouth. 03/20/21  Yes [provider]  topiramate (TOPAMAX) 50 MG tablet Take 50 mg at bedtime for 1 week, then increase to 50 mg twice a day 12/03/22  Yes Chima, Victorino Dike, MD  Ubrogepant (UBRELVY) 100 MG TABS Take by mouth. 03/20/21  Yes [provider]  zolpidem (AMBIEN) 10 MG tablet Take by mouth.   Yes [provider]  albuterol (PROVENTIL HFA;VENTOLIN HFA) 108 (90 Base) MCG/ACT inhaler Inhale 2 puffs into the lungs every 6 (six) hours as needed for wheezing or shortness of  breath.    [provider]  aspirin-sod bicarb-citric acid (ALKA-SELTZER) 325 MG TBEF tablet Take 650 mg by mouth every 6 (six) hours as needed (Indigestion).    [provider]  EPINEPHrine 0.3 mg/0.3 mL IJ SOAJ injection Inject 0.3 mg into the muscle as needed for anaphylaxis. 10/10/21   Wallis Bamberg, PA-C  hydrOXYzine (ATARAX) 25 MG tablet Take 0.5-1 tablets (12.5-25 mg total) by mouth every 8 (eight) hours as needed for itching. Patient not taking: Reported on 12/03/2022 10/10/21   Wallis Bamberg, PA-C  pseudoephedrine (SUDAFED) 60 MG tablet Take 1 tablet (60 mg total) by mouth every 8 (eight) hours as needed for congestion. 07/08/22   Wallis Bamberg, PA-C    Family History Family History  Problem Relation Age of Onset   Breast cancer Maternal Grandmother     Social History Social History   Tobacco Use   Smoking status: Never   Smokeless tobacco: Never  Vaping Use   Vaping status: Never Used  Substance Use Topics   Alcohol use: Yes    Alcohol/week: 2.0 standard drinks of alcohol    Types: 2 Glasses of wine per week   Drug use: No     Allergies   Clindamycin/lincomycin, Penicillins, Bee pollen, Milk-related compounds, Pollen extract, and Acetazolamide   Review of Systems Review of Systems As noted in HPI  Physical Exam Triage Vital Signs ED Triage Vitals  Encounter Vitals Group     BP 01/26/23 1910 129/85     Systolic BP Percentile --      Diastolic BP Percentile --      Pulse Rate 01/26/23 1910 68     Resp 01/26/23 1910 18     Temp 01/26/23 1910 97.6 F (36.4 C)     Temp Source 01/26/23 1910 Oral     SpO2 01/26/23 1910 98 %     Weight --      Height --      Head Circumference --      Peak Flow --      Pain Score 01/26/23 1906 10     Pain Loc --      Pain Education --      Exclude from Growth Chart --    No data found.  Updated Vital Signs BP 129/85 (BP Location: Right Arm)   Pulse 68   Temp 97.6 F (36.4 C) (Oral)   Resp 18   SpO2 98%    Visual Acuity Right Eye Distance:   Left Eye Distance:   Bilateral Distance:    Right Eye Near:   Left Eye Near:    Bilateral Near:     Physical Exam Physical Exam Vitals signs and nursing note reviewed.  Constitutional:  General: SHe is not in acute distress.    Appearance: SHe is well-developed and normal weight. He is not ill-appearing, toxic-appearing or diaphoretic.  HEENT: PERRLA EOMI    Head: Normocephalic.  TM's gray and shiny bilaterally NOSE- mucosa is pink pale with no congestion and clear mucous. Does not have sinus tenderness and I could not reproduce her HA with sinus palpation.  Eyes:     Extraocular Movements: Extraocular movements intact.     Pupils: Pupils are equal, round, and reactive to light.  Neck:     Musculoskeletal: Neck supple. No neck rigidity.     Meningeal: Brudzinski's sign absent.   Pulmonary:     Effort: Pulmonary effort is normal.    .  Musculoskeletal: Normal range of motion of upper and lower extremities.  BACK -no tenderness to palpation, but pain was provoked with lumbar ROM on R lower back and thoracic rotation to her L on thoracic back region.  Lymphadenopathy:     Cervical: No cervical adenopathy.  Skin:    General: Skin is warm and dry.  Neurological:     Mental Status: He is alert.     Cranial Nerves: No cranial nerve deficit or facial asymmetry.     Sensory: No sensory deficit.     Motor: No weakness.     Coordination: Romberg sign negative. Coordination normal.     Gait: Gait normal.     Deep Tendon Reflexes: Reflexes normal on upper extremities and patella bilaterally. Achillis reflex +1/4    Comments: Normal Romberg,  finger to nose, tandem gait.   Psychiatric:        Mood and Affect: Mood normal.        Speech: Speech normal.        Behavior: Behavior normal.    UC Treatments / Results  Labs (all labs ordered are listed, but only abnormal results are displayed) Labs Reviewed - No data to  display  EKG   Radiology DG Foot Complete Left  Result Date: 01/26/2023 Please see detailed radiograph report in office note.   Procedures Procedures (including critical care time)  Medications Ordered in UC Medications - No data to display  Initial Impression / Assessment and Plan / UC Course  I have reviewed the triage vital signs and the nursing notes.  Persistent HA/ Worse HA Worsening rhinitis  I sent her to ER for further work up/    Final Clinical Impressions(s) / UC Diagnoses   Final diagnoses:  Non-seasonal allergic rhinitis, unspecified trigger  Worst headache of life     Discharge Instructions      Go to the ER right now for further work up     ED Prescriptions   None    PDMP not reviewed this encounter.   Garey Ham, Cordelia Poche 01/26/23 2047

## 2023-01-26 NOTE — ED Notes (Signed)
Patient is being discharged from the Urgent Care and sent to the Emergency Department via POV . Per Garey Ham, PA-C, patient is in need of higher level of care due to headache. Patient is aware and verbalizes understanding of plan of care.  Vitals:   01/26/23 1910  BP: 129/85  Pulse: 68  Resp: 18  Temp: 97.6 F (36.4 C)  SpO2: 98%

## 2023-01-26 NOTE — ED Triage Notes (Signed)
Pt states she has a headache and congestion X 2-3 weeks. She has intracranial idiopathic hypertension and has taken all her meds without relief. She said last time it was this mad she had to have a lumbar puncture and fluid drawn off.    She is also back pain at the top and bottom of her back the back pain started to get worse as the headaches got worse.

## 2023-01-27 ENCOUNTER — Telehealth: Payer: Self-pay | Admitting: Psychiatry

## 2023-01-27 NOTE — Telephone Encounter (Signed)
Called pt. Went to [facility] at [facility] last night.  Told to go to ER but unable d/t having daughter with her. Went home.  Taking topamax 50mg  po every day (directions state: Take 50 mg at bedtime for 1 week, then increase to 50 mg twice a day). She didn't realize she was supposed to increase. Willing if MD approves. Aware I will discuss with Dr. Epimenio Foot.    Repots runny nose that started In Sept and given Zpack (ineffective). Then given two steroid inj, allergy meds. Headaches worsened still. Back/neck started hurting after this.  Feels like stabbing pain in top of head, pressure on temples and back of head. She is still working. Sx worse at night and when she gets up in am.  Last eye appt: about a year ago. She is trying to get appt now. Encouraged her to try and schedule appt asap d/t sx she is having. She has  Hx optic nerve swelling.   I updated med list/pharmacy/allergy list on file.

## 2023-01-27 NOTE — Telephone Encounter (Signed)
Pt returned phone call, would like a call back.  

## 2023-01-27 NOTE — Telephone Encounter (Signed)
LVM asking for pt to call office back.

## 2023-01-27 NOTE — Telephone Encounter (Signed)
Left message for patient to call.

## 2023-01-27 NOTE — Telephone Encounter (Signed)
Pt said starting having a bad headache, Sunday  topiramate (TOPAMAX) 50 MG tablet,  Rimegepant Sulfate (NURTEC) 75 MG TBDP , Atogepant (QULIPTA) 60 MG TABS , Ibuprofen,goody Tylenol, Tuesday went to urgent care for severe headache. Urgent Care said could be related to IIH. Level  of pain today is a 7.Would like a call from the nurse to discuss if can get work in appt or earlier appt.

## 2023-01-28 NOTE — Telephone Encounter (Signed)
Left message for patient to call again. 

## 2023-01-28 NOTE — Telephone Encounter (Signed)
Called and spoke with patient informed her with below. Pt said she realized once she looked at the bottle she is taking 2 of the 50 mg tablets (100 mg) daily. States she does feel better, but still has daily headaches.

## 2023-01-28 NOTE — Telephone Encounter (Signed)
Left message for patient to call to see if she can be seen on 02/11/23 @ 9:30 am with Shawnie Dapper, NP

## 2023-02-01 NOTE — Telephone Encounter (Signed)
Patient called back and has been scheduled on 02/11/23

## 2023-02-07 ENCOUNTER — Other Ambulatory Visit: Payer: Self-pay | Admitting: Podiatry

## 2023-02-10 NOTE — Progress Notes (Unsigned)
No chief complaint on file.   HISTORY OF PRESENT ILLNESS:  02/10/23 ALL:  Theresa Mann is a 52 y.o. female here today for follow up for IIH. She was previously followed by Dr Vallery Sa with [facility] and seen in consult with Dr Delena Bali 11/2022. Topirmate was increased to 50mg  BID. Qulipta and Nurtec restarted. Follow up with ophthalmology advised.   Since,    HISTORY (copied from Dr Quentin Mulling previous note)  Medical co-morbidities: hypothyroidism, asthma, IIH, migraines   The patient presents for evaluation of IIH and migraines. 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 Had another LP in 2021 with an opening pressure of 26. She was initially started on Diamox, but was unable to tolerate the side effects so this was switched to Topamax. She was also taking Qulipta and Nurtec for migraines. She stopped these medications around January 2024 as she was unable to follow up with her neurologist in [facility].   She presented to the ED in May for a syncopal episode. TSH at that time was 138. CTH showed a partially empty sella and CTA head/neck was unremarkable. She was started on Topamax 12.5 mg daily as her creatinine at the time was 1.6.   Since that time she has had daily headaches. They are associated with photophobia, phonophobia, and nausea.  Rarely has pulsatile tinnitus. Vision has been stable without any blurry vision or double vision. Last saw ophthalmology last year, and exam at that time was negative for papilledema.   Headache History: Onset: May 2024 Aura: none Location: retro-orbital, neck Quality/Description: pressure Associated Symptoms:             Photophobia: yes             Phonophobia: yes             Nausea: yes Worse with activity?: yes Duration of headaches: constant, fluctuates in severity   Headache days per month: 30 Headache free days per month: 0   Current  Treatment: Abortive BC powder   Preventative Topamax 12.5 mg daily   Prior Therapies                                 Diamox 250 mg - side effects Topamax 200 mg BID gabapentin Qulipta 60 mg daily Maxalt 10 mg PRN Ubrelvy Nurtec 75 mg PRN Phenergan 25 mg PRN Baclofen 10 mg PRN Nerivio   REVIEW OF SYSTEMS: Out of a complete 14 system review of symptoms, the patient complains only of the following symptoms, and all other reviewed systems are negative.   ALLERGIES: Allergies  Allergen Reactions   Clindamycin/Lincomycin Anaphylaxis   Penicillins Anaphylaxis    Has patient had a PCN reaction causing immediate rash, facial/tongue/throat swelling, SOB or lightheadedness with hypotension: Yes Has patient had a PCN reaction causing severe rash involving mucus membranes or skin necrosis: No Has patient had a PCN reaction that required hospitalization Yes Has patient had a PCN reaction occurring within the last 10 years: Yes If all of the above answers are "NO", then may proceed with Cephalosporin use.   Bee Pollen Rash   Milk-Related Compounds     Throat scratchy   Pollen Extract    Acetazolamide Other (See Comments)    lightheaded     HOME MEDICATIONS: Outpatient Medications Prior to Visit  Medication  Sig Dispense Refill   aspirin-sod bicarb-citric acid (ALKA-SELTZER) 325 MG TBEF tablet Take 650 mg by mouth every 6 (six) hours as needed (Indigestion).     Atogepant (QULIPTA) 60 MG TABS Take 1 tablet (60 mg total) by mouth daily. 30 tablet 6   EPINEPHrine 0.3 mg/0.3 mL IJ SOAJ injection Inject 0.3 mg into the muscle as needed for anaphylaxis. 1 each 0   levothyroxine (SYNTHROID) 125 MCG tablet Take 1 tablet (125 mcg total) by mouth daily before breakfast. (Patient taking differently: Take 175 mcg by mouth daily before breakfast.) 30 tablet 3   meloxicam (MOBIC) 15 MG tablet TAKE 1 TABLET(15 MG) BY MOUTH DAILY 30 tablet 0   pseudoephedrine (SUDAFED) 60 MG tablet Take 1 tablet  (60 mg total) by mouth every 8 (eight) hours as needed for congestion. 30 tablet 0   Rimegepant Sulfate (NURTEC) 75 MG TBDP Take 1 tablet (75 mg total) by mouth as needed. 8 tablet 6   topiramate (TOPAMAX) 50 MG tablet Take 50 mg at bedtime for 1 week, then increase to 50 mg twice a day (Patient taking differently: Take 50 mg by mouth daily. Take 50 mg at bedtime for 1 week, then increase to 50 mg twice a day) 60 tablet 6   Ubrogepant (UBRELVY) 100 MG TABS Take 100 mg by mouth as needed.     zolpidem (AMBIEN) 10 MG tablet Take 10 mg by mouth at bedtime.     No facility-administered medications prior to visit.     PAST MEDICAL HISTORY: Past Medical History:  Diagnosis Date   Allergic    Anemia    Asthma    seasoanl - rarely uses inhaler   Heart murmur    Hypothyroidism    SVD (spontaneous vaginal delivery)    x 2   Swelling 2017   LIPS, ARMS AND HAND    Thyroid disease      PAST SURGICAL HISTORY: Past Surgical History:  Procedure Laterality Date   BREAST CYST ASPIRATION Left    BREAST SURGERY Right    benign cyst removed   CHOLECYSTECTOMY     DILATATION & CURETTAGE/HYSTEROSCOPY WITH MYOSURE N/A 11/08/2015   Procedure: DILATATION & CURETTAGE/HYSTEROSCOPY WITH MYOSURE;  Surgeon: Maxie Better, MD;  Location: [facility];  Service: Gynecology;  Laterality: N/A;   DILATATION & CURETTAGE/HYSTEROSCOPY WITH MYOSURE N/A 07/09/2017   Procedure: DILATATION & CURETTAGE/HYSTEROSCOPY;  Surgeon: Maxie Better, MD;  Location: [facility];  Service: Gynecology;  Laterality: N/A;   HYSTEROSCOPY WITH NOVASURE N/A 07/09/2017   Procedure: HYSTEROSCOPY WITH NOVASURE;  Surgeon: Maxie Better, MD;  Location: [facility];  Service: Gynecology;  Laterality: N/A;   TUBAL LIGATION       FAMILY HISTORY: Family History  Problem Relation Age of Onset   Breast cancer Maternal Grandmother      SOCIAL HISTORY: Social History   Socioeconomic History   Marital status: Single    Spouse name: Not on  file   Number of children: Not on file   Years of education: Not on file   Highest education level: Not on file  Occupational History   Not on file  Tobacco Use   Smoking status: Never   Smokeless tobacco: Never  Vaping Use   Vaping status: Never Used  Substance and Sexual Activity   Alcohol use: Yes    Alcohol/week: 2.0 standard drinks of alcohol    Types: 2 Glasses of wine per week   Drug use: No   Sexual activity: Yes  Birth control/protection: Surgical  Other Topics Concern   Not on file  Social History Narrative   Not on file   Social Determinants of Health   Financial Resource Strain: Patient Declined (09/09/2022)   Received from [facility]   Overall Financial Resource Strain (CARDIA)    Difficulty of Paying Living Expenses: Patient declined  Food Insecurity: Patient Declined (09/09/2022)   Received from [facility]   Hunger Vital Sign    Worried About Running Out of Food in the Last Year: Patient declined    Ran Out of Food in the Last Year: Patient declined  Transportation Needs: Patient Declined (09/09/2022)   Received from [facility] - Transportation    Lack of Transportation (Medical): Patient declined    Lack of Transportation (Non-Medical): Patient declined  Physical Activity: Not on file  Stress: Not on file  Social Connections: Unknown (07/17/2021)   Received from [facility]   Social Network    Social Network: Not on file  Intimate Partner Violence: Unknown (06/11/2021)   Received from [facility]   HITS    Physically Hurt: Not on file    Insult or Talk Down To: Not on file    Threaten Physical Harm: Not on file    Scream or Curse: Not on file     PHYSICAL EXAM  There were no vitals filed for this visit. There is no height or weight on file to calculate BMI.  Generalized: Well developed, in no acute distress  Cardiology: normal rate and rhythm, no murmur auscultated  Respiratory: clear to  auscultation bilaterally    Neurological examination  Mentation: Alert oriented to time, place, history taking. Follows all commands speech and language fluent Cranial nerve II-XII: Pupils were equal round reactive to light. Extraocular movements were full, visual field were full on confrontational test. Facial sensation and strength were normal. Uvula tongue midline. Head turning and shoulder shrug  were normal and symmetric. Motor: The motor testing reveals 5 over 5 strength of all 4 extremities. Good symmetric motor tone is noted throughout.  Sensory: Sensory testing is intact to soft touch on all 4 extremities. No evidence of extinction is noted.  Coordination: Cerebellar testing reveals good finger-nose-finger and heel-to-shin bilaterally.  Gait and station: Gait is normal. Tandem gait is normal. Romberg is negative. No drift is seen.  Reflexes: Deep tendon reflexes are symmetric and normal bilaterally.    DIAGNOSTIC DATA (LABS, IMAGING, TESTING) - I reviewed patient records, labs, notes, testing and imaging myself where available.  Lab Results  Component Value Date   WBC 5.8 07/19/2022   HGB 12.6 07/19/2022   HCT 37.0 07/19/2022   MCV 95.2 07/19/2022   PLT 171 07/19/2022      Component Value Date/Time   NA 139 07/19/2022 0959   K 3.3 (L) 07/19/2022 0959   CL 100 07/19/2022 0959   CO2 27 07/19/2022 0933   GLUCOSE 72 07/19/2022 0959   BUN 7 07/19/2022 0959   CREATININE 1.60 (H) 07/19/2022 0959   CALCIUM 9.3 07/19/2022 0933   PROT 6.6 07/19/2022 0933   ALBUMIN 4.0 07/19/2022 0933   AST 31 07/19/2022 0933   ALT 19 07/19/2022 0933   ALKPHOS 46 07/19/2022 0933   BILITOT 0.8 07/19/2022 0933   GFRNONAA 40 (L) 07/19/2022 0933   GFRAA >60 10/02/2017 0912   No results found for: "CHOL", "HDL", "LDLCALC", "LDLDIRECT", "TRIG", "CHOLHDL" No results found for: "HGBA1C" No results found for: "VITAMINB12" Lab Results  Component Value Date   TSH 138.351 (H) 07/19/2022        No  data to display               No data to display           ASSESSMENT AND PLAN  52 y.o. year old female  has a past medical history of Allergic, Anemia, Asthma, Heart murmur, Hypothyroidism, SVD (spontaneous vaginal delivery), Swelling (2017), and Thyroid disease. here with    No diagnosis found.  Dynasty Linus Salmons ***.  Healthy lifestyle habits encouraged. *** will follow up with PCP as directed. *** will return to see me in ***, sooner if needed. *** verbalizes understanding and agreement with this plan.   No orders of the defined types were placed in this encounter.    No orders of the defined types were placed in this encounter.    Shawnie Dapper, MSN, FNP-C 02/10/2023, 11:30 AM  [facility] [address], [address] [place], [place] [address] [phone]

## 2023-02-10 NOTE — Patient Instructions (Signed)
Below is our plan:  We will continue Qulitpa 60mg  daily for now. Increase topiramate to 100mg  twice daily. I am going to prescribe 100mg  tablets. Continue 50mg  in am (1/2 tablet) and increase evening dose to 100mg  (1 tablet) for 5-7 days. If doing well increase morning dose to 100mg  and continue evening dose of 100mg .   We will restart rizatriptan as needed for abortive therapy. Please take 1 tablet at onset of headache. May take 1 additional tablet in 2 hours if needed. Do not take more than 2 tablets in 24 hours or more than 10 in a month.   Discontinue Nurtec and Ubrelvy.   Please make sure you are staying well hydrated. I recommend 50-60 ounces daily. Well balanced diet and regular exercise encouraged. Consistent sleep schedule with 6-8 hours recommended.   Please continue follow up with care team as directed.   Follow up with me in 6 months   You may receive a survey regarding today's visit. I encourage you to leave honest feed back as I do use this information to improve patient care. Thank you for seeing me today!   GENERAL HEADACHE INFORMATION:   Natural supplements: Magnesium Oxide or Magnesium Glycinate 500 mg at bed (up to 800 mg daily) Coenzyme Q10 300 mg in AM Vitamin B2- 200 mg twice a day   Add 1 supplement at a time since even natural supplements can have undesirable side effects. You can sometimes buy supplements cheaper (especially Coenzyme Q10) at www.WebmailGuide.co.za or at [facility].  Migraine with aura: There is increased risk for stroke in women with migraine with aura and a contraindication for the combined contraceptive pill for use by women who have migraine with aura. The risk for women with migraine without aura is lower. However other risk factors like smoking are far more likely to increase stroke risk than migraine. There is a recommendation for no smoking and for the use of OCPs without estrogen such as progestogen only pills particularly for women with migraine with  aura.Marland Kitchen People who have migraine headaches with auras may be 3 times more likely to have a stroke caused by a blood clot, compared to migraine patients who don't see auras. Women who take hormone-replacement therapy may be 30 percent more likely to suffer a clot-based stroke than women not taking medication containing estrogen. Other risk factors like smoking and high blood pressure may be  much more important.    Vitamins and herbs that show potential:   Magnesium: Magnesium (250 mg twice a day or 500 mg at bed) has a relaxant effect on smooth muscles such as blood vessels. Individuals suffering from frequent or daily headache usually have low magnesium levels which can be increase with daily supplementation of 400-750 mg. Three trials found 40-90% average headache reduction  when used as a preventative. Magnesium may help with headaches are aura, the best evidence for magnesium is for migraine with aura is its thought to stop the cortical spreading depression we believe is the pathophysiology of migraine aura.Magnesium also demonstrated the benefit in menstrually related migraine.  Magnesium is part of the messenger system in the serotonin cascade and it is a good muscle relaxant.  It is also useful for constipation which can be a side effect of other medications used to treat migraine. Good sources include nuts, whole grains, and tomatoes. Side Effects: loose stool/diarrhea  Riboflavin (vitamin B 2) 200 mg twice a day. This vitamin assists nerve cells in the production of ATP a principal energy storing  molecule.  It is necessary for many chemical reactions in the body.  There have been at least 3 clinical trials of riboflavin using 400 mg per day all of which suggested that migraine frequency can be decreased.  All 3 trials showed significant improvement in over half of migraine sufferers.  The supplement is found in bread, cereal, milk, meat, and poultry.  Most Americans get more riboflavin than the  recommended daily allowance, however riboflavin deficiency is not necessary for the supplements to help prevent headache. Side effects: energizing, green urine   Coenzyme Q10: This is present in almost all cells in the body and is critical component for the conversion of energy.  Recent studies have shown that a nutritional supplement of CoQ10 can reduce the frequency of migraine attacks by improving the energy production of cells as with riboflavin.  Doses of 150 mg twice a day have been shown to be effective.   Melatonin: Increasing evidence shows correlation between melatonin secretion and headache conditions.  Melatonin supplementation has decreased headache intensity and duration.  It is widely used as a sleep aid.  Sleep is natures way of dealing with migraine.  A dose of 3 mg is recommended to start for headaches including cluster headache. Higher doses up to 15 mg has been reviewed for use in Cluster headache and have been used. The rationale behind using melatonin for cluster is that many theories regarding the cause of Cluster headache center around the disruption of the normal circadian rhythm in the brain.  This helps restore the normal circadian rhythm.   HEADACHE DIET: Foods and beverages which may trigger migraine Note that only 20% of headache patients are food sensitive. You will know if you are food sensitive if you get a headache consistently 20 minutes to 2 hours after eating a certain food. Only cut out a food if it causes headaches, otherwise you might remove foods you enjoy! What matters most for diet is to eat a well balanced healthy diet full of vegetables and low fat protein, and to not miss meals.   Chocolate, other sweets ALL cheeses except cottage and cream cheese Dairy products, yogurt, sour cream, ice cream Liver Meat extracts (Bovril, Marmite, meat tenderizers) Meats or fish which have undergone aging, fermenting, pickling or smoking. These include:  Hotdogs,salami,Lox,sausage, mortadellas,smoked salmon, pepperoni, Pickled herring Pods of broad bean (English beans, Chinese pea pods, [place] (fava) beans, lima and navy beans Ripe avocado, ripe banana Yeast extracts or active yeast preparations such as Brewer's or Fleishman's (commercial bakes goods are permitted) Tomato based foods, pizza (lasagna, etc.)   MSG (monosodium glutamate) is disguised as many things; look for these common aliases: Monopotassium glutamate Autolysed yeast Hydrolysed protein Sodium caseinate "flavorings" "all natural preservatives" Nutrasweet   Avoid all other foods that convincingly provoke headaches.   Resources: The Dizzy [place]air Laundry Your Headache Diet, migrainestrong.com  https://zamora-andrews.com/   Caffeine and Migraine For patients that have migraine, caffeine intake more than 3 days per week can lead to dependency and increased migraine frequency. I would recommend cutting back on your caffeine intake as best you can. The recommended amount of caffeine is 200-300 mg daily, although migraine patients may experience dependency at even lower doses. While you may notice an increase in headache temporarily, cutting back will be helpful for headaches in the long run. For more information on caffeine and migraine, visit: https://americanmigrainefoundation.org/resource-library/caffeine-and-migraine/   Headache Prevention Strategies:   1. Maintain a headache diary; learn to identify and avoid triggers.  -  This can be a simple note where you log when you had a headache, associated symptoms, and medications used - There are several smartphone apps developed to help track migraines: Migraine Buddy, Migraine Monitor, Curelator N1-Headache App   Common triggers include: Emotional triggers: Emotional/Upset family or friends Emotional/Upset occupation Business reversal/success Anticipation  anxiety Crisis-serious Post-crisis periodNew job/position   Physical triggers: Vacation Day Weekend Strenuous Exercise High Altitude Location New Move Menstrual Day Physical Illness Oversleep/Not enough sleep Weather changes Light: Photophobia or light sesnitivity treatment involves a balance between desensitization and reduction in overly strong input. Use dark polarized glasses outside, but not inside. Avoid bright or fluorescent light, but do not dim environment to the point that going into a normally lit room hurts. Consider FL-41 tint lenses, which reduce the most irritating wavelengths without blocking too much light.  These can be obtained at axonoptics.com or theraspecs.com Foods: see list above.   2. Limit use of acute treatments (over-the-counter medications, triptans, etc.) to no more than 2 days per week or 10 days per month to prevent medication overuse headache (rebound headache).     3. Follow a regular schedule (including weekends and holidays): Don't skip meals. Eat a balanced diet. 8 hours of sleep nightly. Minimize stress. Exercise 30 minutes per day. Being overweight is associated with a 5 times increased risk of chronic migraine. Keep well hydrated and drink 6-8 glasses of water per day.   4. Initiate non-pharmacologic measures at the earliest onset of your headache. Rest and quiet environment. Relax and reduce stress. Breathe2Relax is a free app that can instruct you on    some simple relaxtion and breathing techniques. Http://Dawnbuse.com is a    free website that provides teaching videos on relaxation.  Also, there are  many apps that   can be downloaded for "mindful" relaxation.  An app called YOGA NIDRA will help walk you through mindfulness. Another app called Calm can be downloaded to give you a structured mindfulness guide with daily reminders and skill development. Headspace for guided meditation Mindfulness Based Stress Reduction Online Course:  www.palousemindfulness.com Cold compresses.   5. Don't wait!! Take the maximum allowable dosage of prescribed medication at the first sign of migraine.   6. Compliance:  Take prescribed medication regularly as directed and at the first sign of a migraine.   7. Communicate:  Call your physician when problems arise, especially if your headaches change, increase in frequency/severity, or become associated with neurological symptoms (weakness, numbness, slurred speech, etc.). Proceed to emergency room if you experience new or worsening symptoms or symptoms do not resolve, if you have new neurologic symptoms or if headache is severe, or for any concerning symptom.   8. Headache/pain management therapies: Consider various complementary methods, including medication, behavioral therapy, psychological counselling, biofeedback, massage therapy, acupuncture, dry needling, and other modalities.  Such measures may reduce the need for medications. Counseling for pain management, where patients learn to function and ignore/minimize their pain, seems to work very well.   9. Recommend changing family's attention and focus away from patient's headaches. Instead, emphasize daily activities. If first question of day is 'How are your headaches/Do you have a headache today?', then patient will constantly think about headaches, thus making them worse. Goal is to re-direct attention away from headaches, toward daily activities and other distractions.   10. Helpful Websites: www.AmericanHeadacheSociety.org PatentHood.ch www.headaches.org TightMarket.nl www.achenet.org

## 2023-02-11 ENCOUNTER — Ambulatory Visit (INDEPENDENT_AMBULATORY_CARE_PROVIDER_SITE_OTHER): Payer: Managed Care, Other (non HMO) | Admitting: Family Medicine

## 2023-02-11 ENCOUNTER — Encounter: Payer: Self-pay | Admitting: Family Medicine

## 2023-02-11 VITALS — BP 123/83 | HR 77 | Ht 68.0 in | Wt 189.0 lb

## 2023-02-11 DIAGNOSIS — G932 Benign intracranial hypertension: Secondary | ICD-10-CM | POA: Diagnosis not present

## 2023-02-11 MED ORDER — TOPIRAMATE 100 MG PO TABS: 100.0000 mg | ORAL_TABLET | Freq: Two times a day (BID) | ORAL | 3 refills | Status: DC

## 2023-02-11 MED ORDER — RIZATRIPTAN BENZOATE 10 MG PO TBDP: 10.0000 mg | ORAL_TABLET | ORAL | 11 refills | Status: AC | PRN

## 2023-03-12 ENCOUNTER — Other Ambulatory Visit: Payer: Self-pay | Admitting: Podiatry

## 2023-04-12 ENCOUNTER — Telehealth (INDEPENDENT_AMBULATORY_CARE_PROVIDER_SITE_OTHER): Payer: Self-pay | Admitting: Otolaryngology

## 2023-04-12 NOTE — Telephone Encounter (Signed)
Confirmed appt & location [id number]

## 2023-04-13 ENCOUNTER — Ambulatory Visit (INDEPENDENT_AMBULATORY_CARE_PROVIDER_SITE_OTHER): Payer: Managed Care, Other (non HMO) | Admitting: Otolaryngology

## 2023-04-13 ENCOUNTER — Encounter (INDEPENDENT_AMBULATORY_CARE_PROVIDER_SITE_OTHER): Payer: Self-pay

## 2023-04-13 VITALS — BP 128/84 | HR 69 | Ht 68.0 in | Wt 181.0 lb

## 2023-04-13 DIAGNOSIS — J343 Hypertrophy of nasal turbinates: Secondary | ICD-10-CM

## 2023-04-13 DIAGNOSIS — J329 Chronic sinusitis, unspecified: Secondary | ICD-10-CM

## 2023-04-13 DIAGNOSIS — R0981 Nasal congestion: Secondary | ICD-10-CM | POA: Diagnosis not present

## 2023-04-13 DIAGNOSIS — R0982 Postnasal drip: Secondary | ICD-10-CM | POA: Diagnosis not present

## 2023-04-13 DIAGNOSIS — J324 Chronic pansinusitis: Secondary | ICD-10-CM | POA: Insufficient documentation

## 2023-04-13 MED ORDER — IPRATROPIUM BROMIDE 0.06 % NA SOLN: 2.0000 | Freq: Two times a day (BID) | NASAL | 12 refills | Status: AC | PRN

## 2023-04-14 DIAGNOSIS — J343 Hypertrophy of nasal turbinates: Secondary | ICD-10-CM | POA: Insufficient documentation

## 2023-04-14 NOTE — Progress Notes (Signed)
 Patient ID: Theresa Mann, female   DOB: 1970/09/03, 53 y.o.   MRN: [id number]  CC: Chronic nasal drainage, facial pain  HPI:  Theresa Mann is a 53 y.o. female who presents today complaining of chronic nasal drainage since fourth of 2024.  Her symptoms started with bilateral facial pain and nasal drainage.  She was treated with multiple antibiotics and steroid injections.  She was also evaluated by her dentist.  No dental infection was noted.  The patient has a history of environmental allergies.  She uses over-the-counter allergy medication as needed.  She was previously treated with immunotherapy for several years.  She stopped the allergy shots approximately 10 years ago.  The patient has a history of migraine headaches.  She is currently being followed by her neurologist.  She has no previous ENT surgery.  Past Medical History:  Diagnosis Date   Allergic    Anemia    Asthma    seasoanl - rarely uses inhaler   Heart murmur    Hypothyroidism    SVD (spontaneous vaginal delivery)    x 2   Swelling 2017   LIPS, ARMS AND HAND    Thyroid  disease     Past Surgical History:  Procedure Laterality Date   BREAST CYST ASPIRATION Left    BREAST SURGERY Right    benign cyst removed   CHOLECYSTECTOMY     DILATATION & CURETTAGE/HYSTEROSCOPY WITH MYOSURE N/A 11/08/2015   Procedure: DILATATION & CURETTAGE/HYSTEROSCOPY WITH MYOSURE;  Surgeon: Dickie Carder, MD;  Location: [facility];  Service: Gynecology;  Laterality: N/A;   DILATATION & CURETTAGE/HYSTEROSCOPY WITH MYOSURE N/A 07/09/2017   Procedure: DILATATION & CURETTAGE/HYSTEROSCOPY;  Surgeon: Carder Dickie, MD;  Location: [facility];  Service: Gynecology;  Laterality: N/A;   HYSTEROSCOPY WITH NOVASURE N/A 07/09/2017   Procedure: HYSTEROSCOPY WITH NOVASURE;  Surgeon: Carder Dickie, MD;  Location: [facility];  Service: Gynecology;  Laterality: N/A;   TUBAL LIGATION      Family History  Problem Relation Age of Onset   Breast  cancer Maternal Grandmother     Social History:  reports that she has never smoked. She has never used smokeless tobacco. She reports current alcohol use of about 2.0 standard drinks of alcohol per week. She reports that she does not use drugs.  Allergies:  Allergies  Allergen Reactions   Clindamycin /Lincomycin Anaphylaxis   Penicillins Anaphylaxis    Has patient had a PCN reaction causing immediate rash, facial/tongue/throat swelling, SOB or lightheadedness with hypotension: Yes Has patient had a PCN reaction causing severe rash involving mucus membranes or skin necrosis: No Has patient had a PCN reaction that required hospitalization Yes Has patient had a PCN reaction occurring within the last 10 years: Yes If all of the above answers are NO, then may proceed with Cephalosporin use.   Bee Pollen Rash   Milk-Related Compounds     Throat scratchy   Pollen Extract    Acetazolamide Other (See Comments)    lightheaded    Prior to Admission medications   Medication Sig Start Date End Date Taking? Authorizing Provider  aspirin-sod bicarb-citric acid (ALKA-SELTZER) 325 MG TBEF tablet Take 650 mg by mouth every 6 (six) hours as needed (Indigestion).   Yes [provider]  Atogepant  (QULIPTA ) 60 MG TABS Take 1 tablet (60 mg total) by mouth daily. 12/03/22  Yes Rush Nest, MD  EPINEPHrine  0.3 mg/0.3 mL IJ SOAJ injection Inject 0.3 mg into the muscle as needed for anaphylaxis. 10/10/21  Yes Christopher Savannah,  PA-C  ipratropium (ATROVENT ) 0.06 % nasal spray Place 2 sprays into both nostrils 2 (two) times daily as needed (Nasal drainage). 04/13/23 05/13/23 Yes Karis Clunes, MD  levothyroxine  (SYNTHROID ) 125 MCG tablet Take 1 tablet (125 mcg total) by mouth daily before breakfast. 07/19/22  Yes Plunkett, Benton, MD  levothyroxine  (SYNTHROID ) 175 MCG tablet Take 175 mcg by mouth daily before breakfast.   Yes [provider]  meloxicam  (MOBIC ) 15 MG tablet TAKE 1 TABLET(15 MG) BY MOUTH DAILY  03/12/23  Yes Sikora, Rebecca, DPM  pseudoephedrine  (SUDAFED) 60 MG tablet Take 1 tablet (60 mg total) by mouth every 8 (eight) hours as needed for congestion. 07/08/22  Yes Christopher Savannah, PA-C  rizatriptan  (MAXALT -MLT) 10 MG disintegrating tablet Take 1 tablet (10 mg total) by mouth as needed for migraine. May repeat in 2 hours if needed 02/11/23  Yes Lomax, Amy, NP  topiramate  (TOPAMAX ) 100 MG tablet Take 1 tablet (100 mg total) by mouth 2 (two) times daily. 02/11/23  Yes Lomax, Amy, NP  zolpidem (AMBIEN) 10 MG tablet Take 10 mg by mouth at bedtime.   Yes [provider]    Blood pressure 128/84, pulse 69, height 5' 8 (1.727 m), weight 181 lb (82.1 kg), SpO2 98%. Exam: General: Communicates without difficulty, well nourished, no acute distress. Head: Normocephalic, no evidence injury, no tenderness, facial buttresses intact without stepoff. Face/sinus: No tenderness to palpation and percussion. Facial movement is normal and symmetric. Eyes: PERRL, EOMI. No scleral icterus, conjunctivae clear. Neuro: CN II exam reveals vision grossly intact.  No nystagmus at any point of gaze. Ears: Auricles well formed without lesions.  Ear canals are intact without mass or lesion.  No erythema or edema is appreciated.  The TMs are intact without fluid. Nose: External evaluation reveals normal support and skin without lesions.  Dorsum is intact.  Anterior rhinoscopy reveals congested mucosa over anterior aspect of inferior turbinates and intact septum.  No purulence noted. Oral:  Oral cavity and oropharynx are intact, symmetric, without erythema or edema.  Mucosa is moist without lesions. Neck: Full range of motion without pain.  There is no significant lymphadenopathy.  No masses palpable.  Thyroid  bed within normal limits to palpation.  Parotid glands and submandibular glands equal bilaterally without mass.  Trachea is midline. Neuro:  CN 2-12 grossly intact.   Procedure:  Flexible Nasal Endoscopy: Description:  Risks, benefits, and alternatives of flexible endoscopy were explained to the patient.  Specific mention was made of the risk of throat numbness with difficulty swallowing, possible bleeding from the nose and mouth, and pain from the procedure.  The patient gave oral consent to proceed.  The flexible scope was inserted into the right nasal cavity.  Endoscopy of the interior nasal cavity, superior, inferior, and middle meatus was performed. The sphenoid-ethmoid recess was examined. Edematous mucosa was noted.  No polyp, mass, or lesion was appreciated. Olfactory cleft was clear.  Nasopharynx with postnasal drainage.  Turbinates were hypertrophied but without mass.  The procedure was repeated on the contralateral side with similar findings.  The patient tolerated the procedure well.    Assessment: 1.  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 2.  Chronic postnasal drainage.  Plan: 1.  The physical exam and nasal endoscopy findings are reviewed with the patient. 2.  Atrovent  nasal spray to treat the chronic postnasal drainage. 3.  Sinus CT scan to evaluate for possible chronic sinusitis. 4.  The patient will return for reevaluation after  her CT scan.  Caliph Bor[place] W Allyana Vogan 04/14/2023, 10:11 AM

## 2023-05-25 ENCOUNTER — Telehealth (INDEPENDENT_AMBULATORY_CARE_PROVIDER_SITE_OTHER): Payer: Self-pay | Admitting: Otolaryngology

## 2023-05-25 NOTE — Telephone Encounter (Signed)
 LVM to r/s appt - CT not done yet. [id number] afm

## 2023-05-26 ENCOUNTER — Ambulatory Visit (INDEPENDENT_AMBULATORY_CARE_PROVIDER_SITE_OTHER): Payer: Managed Care, Other (non HMO)

## 2023-05-31 ENCOUNTER — Telehealth: Payer: Self-pay

## 2023-05-31 ENCOUNTER — Other Ambulatory Visit (HOSPITAL_COMMUNITY): Payer: Self-pay

## 2023-05-31 NOTE — Telephone Encounter (Signed)
 Pharmacy Patient Advocate Encounter   Received notification from CoverMyMeds that prior authorization for Qulipta 60MG  tablets is required/requested.   Insurance verification completed.   The patient is insured through [facility] .   Per test claim: PA required; PA submitted to above mentioned insurance via CoverMyMeds Key/confirmation #/EOC [id number] Status is pending

## 2023-06-02 ENCOUNTER — Ambulatory Visit: Payer: Self-pay | Admitting: Neurology

## 2023-06-02 ENCOUNTER — Encounter: Payer: Self-pay | Admitting: Neurology

## 2023-06-02 ENCOUNTER — Encounter: Payer: Self-pay | Admitting: Family Medicine

## 2023-06-02 NOTE — Telephone Encounter (Signed)
 Theresa Mann, Qulipta denied. Appears insurance wants her to try and fail all of the injectable CGRP's first. Did you want to change her therapy to one of these?

## 2023-06-02 NOTE — Telephone Encounter (Signed)
 Pharmacy Patient Advocate Encounter  Received notification from [facility] that Prior Authorization for Qulipta 60MG  tablets has been DENIED.  Full denial letter will be uploaded to the media tab. See denial reason below.   PA #/Case ID/Reference #: PA Case ID #: [id number]

## 2023-08-31 NOTE — Progress Notes (Deleted)
 No chief complaint on file.   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 She was last seen by me 02/2023 and reporting continued headaches. We increased topiramate  to 100mg  BID, continued Qulipta  60mg  daily and switched Nurtec to rizatriptan . Updated eye exam advised.   Since,   02/11/2023 ALL:  Theresa Mann is a 53 y.o. female here today for follow up for IIH. She was previously followed by Dr Arch with [facility] and seen in consult with Dr Rush 11/2022. Topirmate was increased to 50mg  BID. Qulipta  and Nurtec restarted. Follow up with ophthalmology advised.   Since, she reports headaches have continued. She is having nearly daily headaches. Described pressure and blurred vision. She has not seen ophthalmology but has appt next week. No vision loss. She does not feel Nurtec has been helpful. Feels rizatriptan  as works best for her in the past. Levothyroxine  was recently increased to 175mcg. Headaches have seemed better over the past 2 weeks. She may drink 20-30 ounces of water daily.   HISTORY (copied from Dr Merna previous note)  Medical co-morbidities: hypothyroidism, asthma, IIH, migraines   The patient presents for evaluation of IIH and migraines. 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 Had another LP in 2021 with an opening pressure of 26. She was initially started on Diamox, but was unable to tolerate the side effects so this was switched to Topamax . She was also taking Qulipta  and Nurtec for migraines. She stopped these medications around January 2024 as she was unable to follow up with her neurologist in [facility].   She presented to the ED in May for a syncopal episode. TSH at that time was 138. CTH showed a partially empty sella and CTA head/neck was unremarkable. She was started on Topamax  12.5 mg daily as her creatinine at the time  was 1.6.   Since that time she has had daily headaches. They are associated with photophobia, phonophobia, and nausea.  Rarely has pulsatile tinnitus. Vision has been stable without any blurry vision or double vision. Last saw ophthalmology last year, and exam at that time was negative for papilledema.   Headache History: Onset: May 2024 Aura: none Location: retro-orbital, neck Quality/Description: pressure Associated Symptoms:             Photophobia: yes             Phonophobia: yes             Nausea: yes Worse with activity?: yes Duration of headaches: constant, fluctuates in severity   Headache days per month: 30 Headache free days per month: 0   Current Treatment: Abortive BC powder   Preventative Topamax  12.5 mg daily   Prior Therapies                                 Diamox 250 mg - side effects Topamax  200 mg BID gabapentin  Qulipta  60 mg daily Maxalt  10 mg PRN Ubrelvy Nurtec 75 mg PRN Phenergan 25 mg PRN Baclofen 10 mg PRN Nerivio   REVIEW OF SYSTEMS: Out of a complete 14 system review of symptoms, the patient complains only of the following symptoms, headaches, blurred vision, and all other reviewed systems are negative.   ALLERGIES: Allergies  Allergen Reactions   Clindamycin /Lincomycin Anaphylaxis   Penicillins  Anaphylaxis    Has patient had a PCN reaction causing immediate rash, facial/tongue/throat swelling, SOB or lightheadedness with hypotension: Yes Has patient had a PCN reaction causing severe rash involving mucus membranes or skin necrosis: No Has patient had a PCN reaction that required hospitalization Yes Has patient had a PCN reaction occurring within the last 10 years: Yes If all of the above answers are NO, then may proceed with Cephalosporin use.   Bee Pollen Rash   Milk-Related Compounds     Throat scratchy   Pollen Extract    Acetazolamide Other (See Comments)    lightheaded     HOME MEDICATIONS: Outpatient Medications Prior to  Visit  Medication Sig Dispense Refill   aspirin-sod bicarb-citric acid (ALKA-SELTZER) 325 MG TBEF tablet Take 650 mg by mouth every 6 (six) hours as needed (Indigestion).     Atogepant  (QULIPTA ) 60 MG TABS Take 1 tablet (60 mg total) by mouth daily. 30 tablet 6   EPINEPHrine  0.3 mg/0.3 mL IJ SOAJ injection Inject 0.3 mg into the muscle as needed for anaphylaxis. 1 each 0   ipratropium (ATROVENT ) 0.06 % nasal spray Place 2 sprays into both nostrils 2 (two) times daily as needed (Nasal drainage). 15 mL 12   levothyroxine  (SYNTHROID ) 125 MCG tablet Take 1 tablet (125 mcg total) by mouth daily before breakfast. 30 tablet 3   levothyroxine  (SYNTHROID ) 175 MCG tablet Take 175 mcg by mouth daily before breakfast.     meloxicam  (MOBIC ) 15 MG tablet TAKE 1 TABLET(15 MG) BY MOUTH DAILY 30 tablet 0   pseudoephedrine  (SUDAFED) 60 MG tablet Take 1 tablet (60 mg total) by mouth every 8 (eight) hours as needed for congestion. 30 tablet 0   rizatriptan  (MAXALT -MLT) 10 MG disintegrating tablet Take 1 tablet (10 mg total) by mouth as needed for migraine. May repeat in 2 hours if needed 9 tablet 11   topiramate  (TOPAMAX ) 100 MG tablet Take 1 tablet (100 mg total) by mouth 2 (two) times daily. 180 tablet 3   zolpidem (AMBIEN) 10 MG tablet Take 10 mg by mouth at bedtime.     No facility-administered medications prior to visit.     PAST MEDICAL HISTORY: Past Medical History:  Diagnosis Date   Allergic    Anemia    Asthma    seasoanl - rarely uses inhaler   Heart murmur    Hypothyroidism    SVD (spontaneous vaginal delivery)    x 2   Swelling 2017   LIPS, ARMS AND HAND    Thyroid  disease      PAST SURGICAL HISTORY: Past Surgical History:  Procedure Laterality Date   BREAST CYST ASPIRATION Left    BREAST SURGERY Right    benign cyst removed   CHOLECYSTECTOMY     DILATATION & CURETTAGE/HYSTEROSCOPY WITH MYOSURE N/A 11/08/2015   Procedure: DILATATION & CURETTAGE/HYSTEROSCOPY WITH MYOSURE;  Surgeon:  Dickie Carder, MD;  Location: [facility];  Service: Gynecology;  Laterality: N/A;   DILATATION & CURETTAGE/HYSTEROSCOPY WITH MYOSURE N/A 07/09/2017   Procedure: DILATATION & CURETTAGE/HYSTEROSCOPY;  Surgeon: Carder Dickie, MD;  Location: [facility];  Service: Gynecology;  Laterality: N/A;   HYSTEROSCOPY WITH NOVASURE N/A 07/09/2017   Procedure: HYSTEROSCOPY WITH NOVASURE;  Surgeon: Carder Dickie, MD;  Location: [facility];  Service: Gynecology;  Laterality: N/A;   TUBAL LIGATION       FAMILY HISTORY: Family History  Problem Relation Age of Onset   Breast cancer Maternal Grandmother      SOCIAL HISTORY: Social History  Socioeconomic History   Marital status: Single    Spouse name: Not on file   Number of children: Not on file   Years of education: Not on file   Highest education level: Not on file  Occupational History   Not on file  Tobacco Use   Smoking status: Never   Smokeless tobacco: Never  Vaping Use   Vaping status: Never Used  Substance and Sexual Activity   Alcohol use: Yes    Alcohol/week: 2.0 standard drinks of alcohol    Types: 2 Glasses of wine per week   Drug use: No   Sexual activity: Yes    Birth control/protection: Surgical  Other Topics Concern   Not on file  Social History Narrative   Not on file   Social Drivers of Health   Financial Resource Strain: Patient Declined (09/09/2022)   Received from [facility]   Overall Financial Resource Strain (CARDIA)    Difficulty of Paying Living Expenses: Patient declined  Food Insecurity: Patient Declined (09/09/2022)   Received from [facility]   Hunger Vital Sign    Within the past 12 months, you worried that your food would run out before you got the money to buy more.: Patient declined    Within the past 12 months, the food you bought just didn't last and you didn't have money to get more.: Patient declined  Transportation Needs: Patient Declined (09/09/2022)   Received from [facility]  - Transportation    Lack of Transportation (Medical): Patient declined    Lack of Transportation (Non-Medical): Patient declined  Physical Activity: Not on file  Stress: Not on file  Social Connections: Unknown (07/17/2021)   Received from [facility]   Social Network    Social Network: Not on file  Intimate Partner Violence: Unknown (06/11/2021)   Received from [facility]   HITS    Physically Hurt: Not on file    Insult or Talk Down To: Not on file    Threaten Physical Harm: Not on file    Scream or Curse: Not on file     PHYSICAL EXAM  There were no vitals filed for this visit.  There is no height or weight on file to calculate BMI.  Generalized: Well developed, in no acute distress  Cardiology: normal rate and rhythm, no murmur auscultated  Respiratory: clear to auscultation bilaterally    Neurological examination  Mentation: Alert oriented to time, place, history taking. Follows all commands speech and language fluent Cranial nerve II-XII: Pupils were equal round reactive to light. Extraocular movements were full, visual field were full on confrontational test. Facial sensation and strength were normal. Uvula tongue midline. Head turning and shoulder shrug  were normal and symmetric. Motor: The motor testing reveals 5 over 5 strength of all 4 extremities. Good symmetric motor tone is noted throughout.  Gait and station: Gait is normal.    DIAGNOSTIC DATA (LABS, IMAGING, TESTING) - I reviewed patient records, labs, notes, testing and imaging myself where available.  Lab Results  Component Value Date   WBC 5.8 07/19/2022   HGB 12.6 07/19/2022   HCT 37.0 07/19/2022   MCV 95.2 07/19/2022   PLT 171 07/19/2022      Component Value Date/Time   NA 139 07/19/2022 0959   K 3.3 (L) 07/19/2022 0959   CL 100 07/19/2022 0959   CO2 27 07/19/2022 0933   GLUCOSE 72 07/19/2022 0959   BUN 7 07/19/2022 0959   CREATININE 1.60 (H)  07/19/2022 0959   CALCIUM 9.3 07/19/2022 0933    PROT 6.6 07/19/2022 0933   ALBUMIN 4.0 07/19/2022 0933   AST 31 07/19/2022 0933   ALT 19 07/19/2022 0933   ALKPHOS 46 07/19/2022 0933   BILITOT 0.8 07/19/2022 0933   GFRNONAA 40 (L) 07/19/2022 0933   GFRAA >60 10/02/2017 0912   No results found for: CHOL, HDL, LDLCALC, LDLDIRECT, TRIG, CHOLHDL No results found for: YHAJ8R No results found for: VITAMINB12 Lab Results  Component Value Date   TSH 138.351 (H) 07/19/2022        No data to display               No data to display           ASSESSMENT AND PLAN  53 y.o. year old female  has a past medical history of Allergic, Anemia, Asthma, Heart murmur, Hypothyroidism, SVD (spontaneous vaginal delivery), Swelling (2017), and Thyroid  disease. here with    No diagnosis found.  Albirda Melana Hingle reports continued headaches. We will continue Qulipta  for now. She will increase topiramate  to 100mg  BID using titration schedule given. We will restart rizatriptan  for abortive therapy. Possible side effects and appropriate dosing discussed. She will follow up with PCP and ophthalmology as scheduled. I have asked she had eye exam sent to us  for review. I have encouraged her to drink more water. Healthy lifestyle habits encouraged. She will follow up with PCP as directed. She will return to see me in 6 months, sooner if needed. She verbalizes understanding and agreement with this plan.   No orders of the defined types were placed in this encounter.    No orders of the defined types were placed in this encounter.    Greig Forbes, MSN, FNP-C 08/31/2023, 8:52 AM  [facility] [address] [place], [place] [address] [phone]

## 2023-08-31 NOTE — Patient Instructions (Incomplete)

## 2023-09-01 ENCOUNTER — Telehealth: Payer: Self-pay | Admitting: Family Medicine

## 2023-09-01 ENCOUNTER — Ambulatory Visit: Payer: Managed Care, Other (non HMO) | Admitting: Family Medicine

## 2023-09-01 DIAGNOSIS — G932 Benign intracranial hypertension: Secondary | ICD-10-CM

## 2023-09-01 DIAGNOSIS — G43719 Chronic migraine without aura, intractable, without status migrainosus: Secondary | ICD-10-CM

## 2023-09-01 NOTE — Telephone Encounter (Signed)
 request to cancel appointment, will call back to r/s

## 2023-11-24 IMAGING — MG MM DIGITAL SCREENING BILAT W/ TOMO AND CAD
8 series · 8 of 24 positions shown · non-contrast
Comparison: Previous exam(s).

CLINICAL DATA: Screening.

EXAM:
DIGITAL SCREENING BILATERAL MAMMOGRAM WITH TOMOSYNTHESIS AND CAD
TECHNIQUE: Bilateral screening digital craniocaudal and mediolateral oblique
mammograms were obtained. Bilateral screening digital breast
tomosynthesis was performed. The images were evaluated with
computer-aided detection.

[R MLO synth-2D]
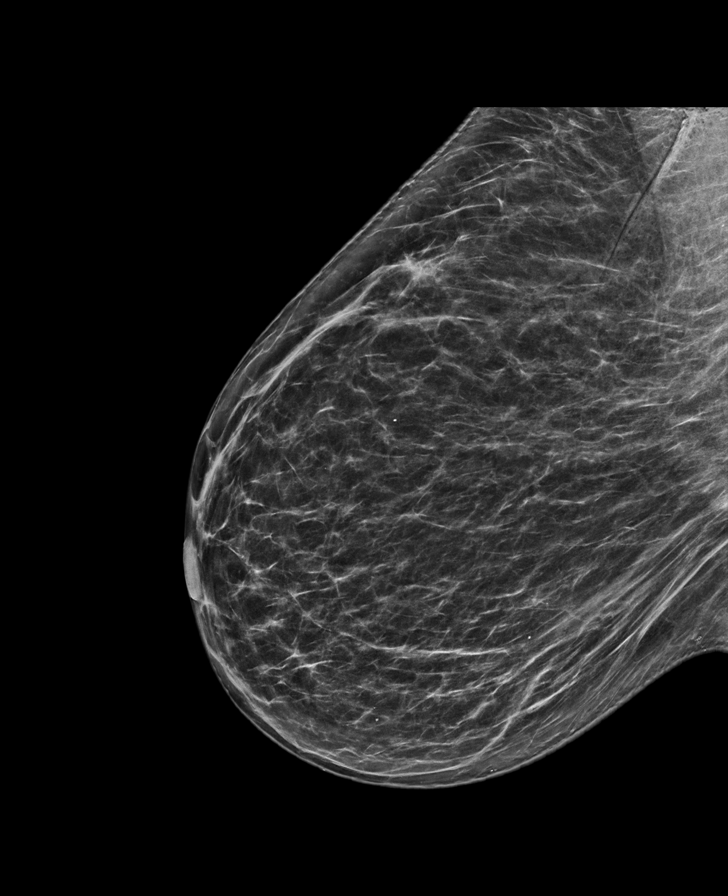

[L MLO synth-2D]
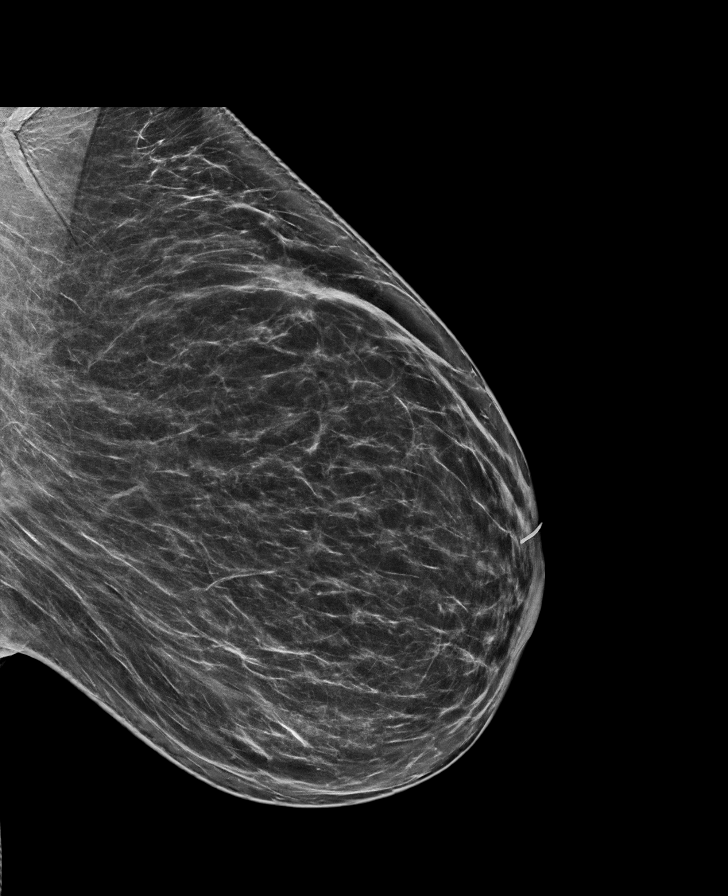

[R CC synth-2D]
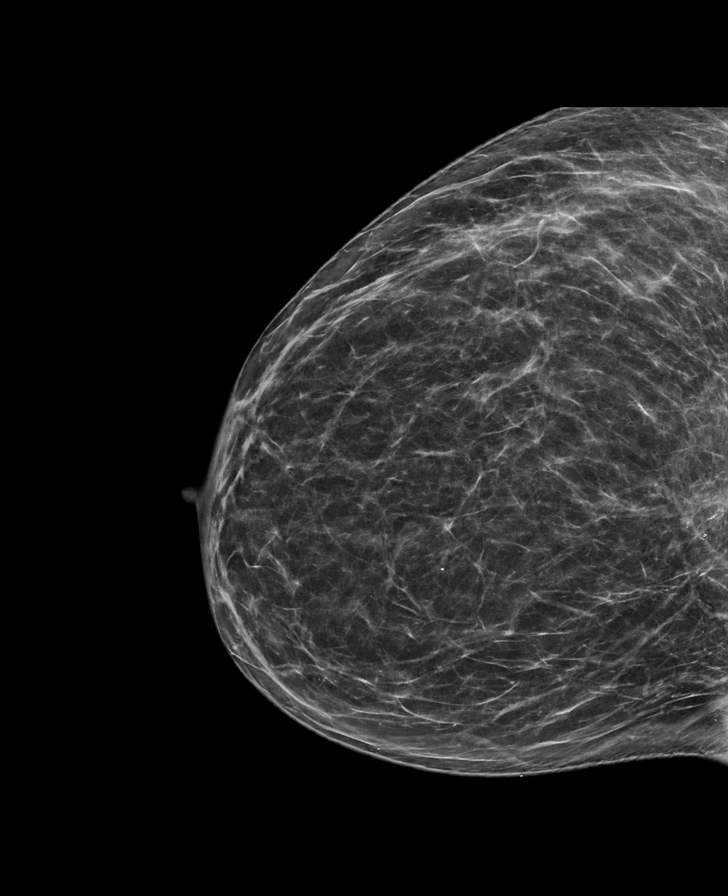

[L CC synth-2D]
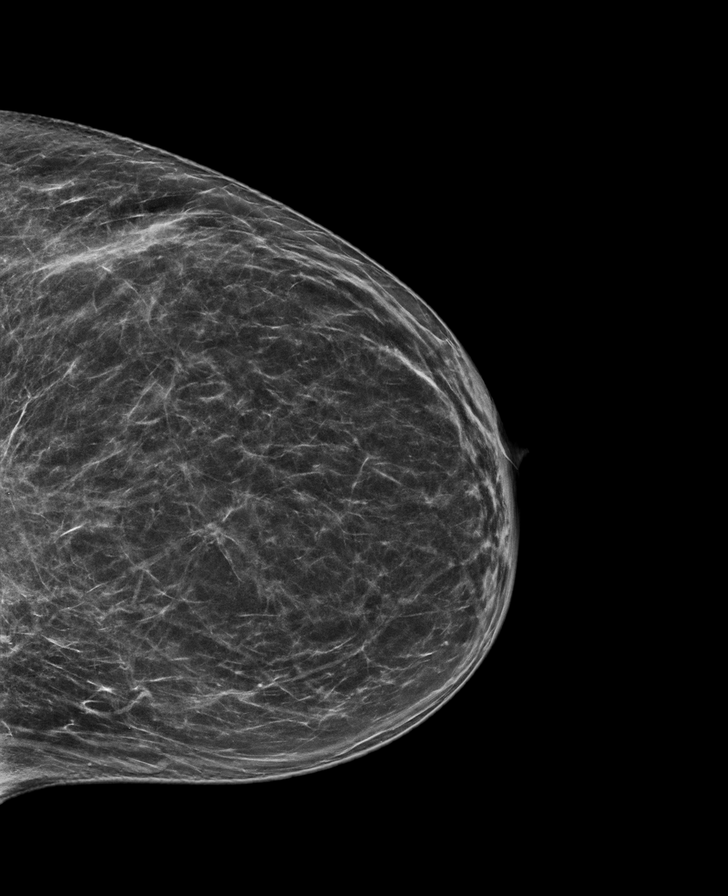

[L CC tomo · tomo slice 37/74.0]
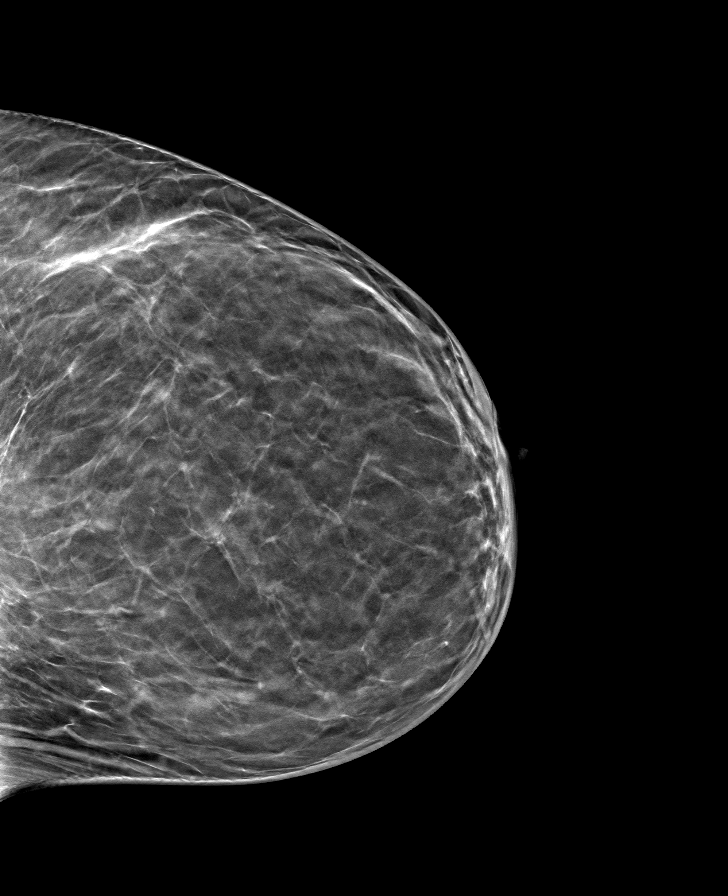

[L MLO tomo · tomo slice 41/80.0]
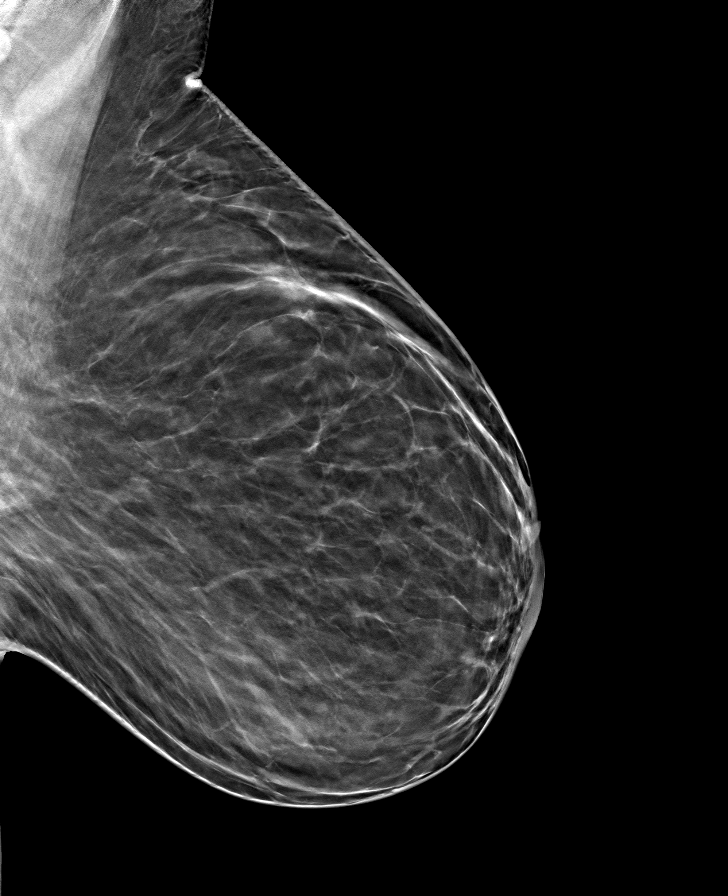

[R MLO tomo · tomo slice 39/77.0]
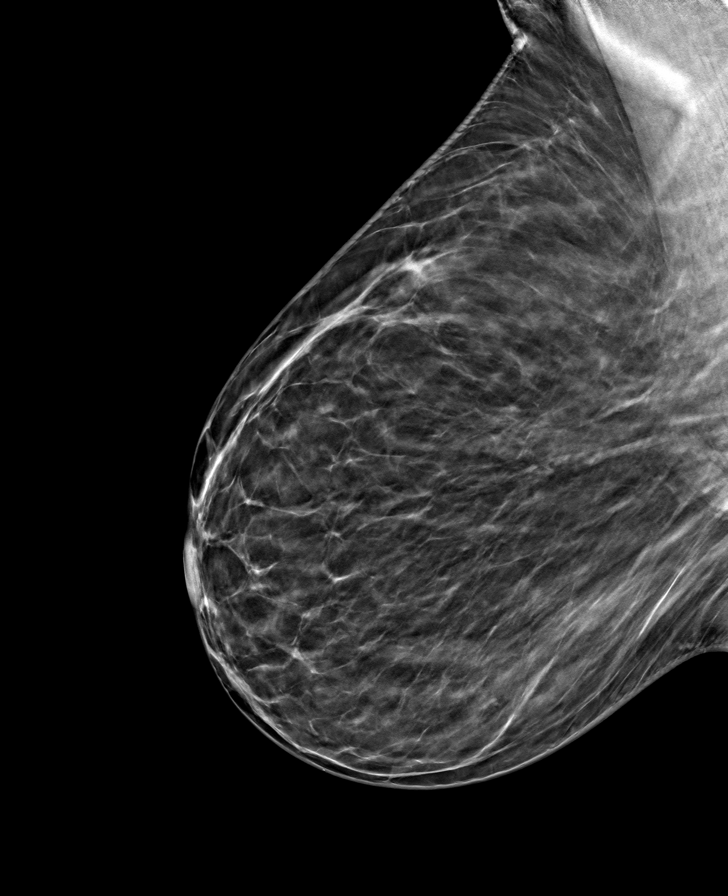

[R CC tomo · tomo slice 37/72.0]
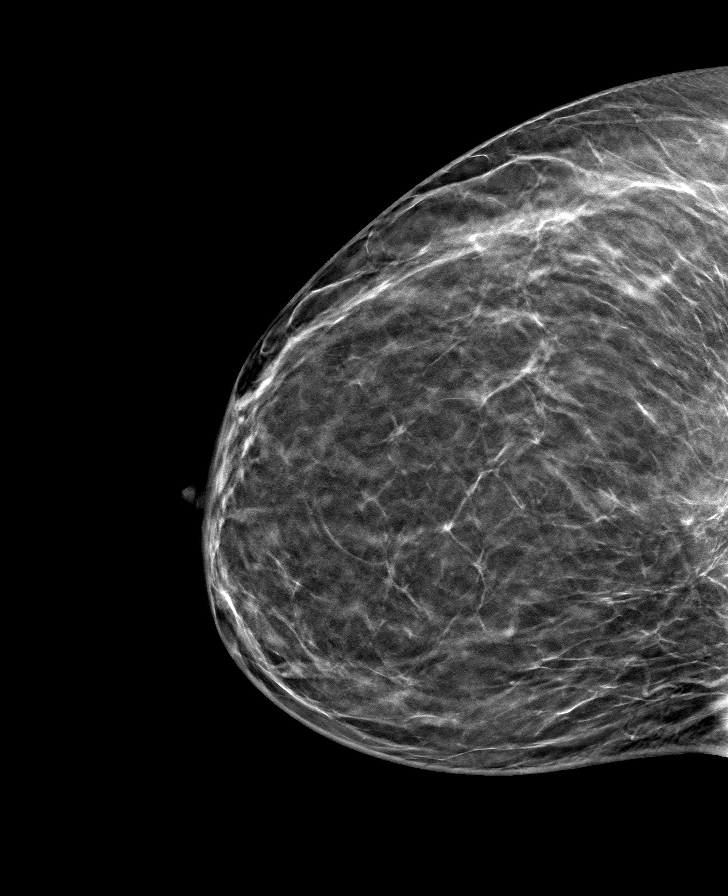

[8 of 24 positions shown; findings below may reference images not displayed]

ACR Breast Density Category b: There are scattered areas of
fibroglandular density.
FINDINGS: There are no findings suspicious for malignancy.
IMPRESSION: No mammographic evidence of malignancy. A result letter of this
screening mammogram will be mailed directly to the patient.

RECOMMENDATION:
Screening mammogram in one year. (Code:51-O-LD2)

BI-RADS CATEGORY  1: Negative.

## 2024-01-20 ENCOUNTER — Other Ambulatory Visit: Payer: Self-pay | Admitting: Nurse Practitioner

## 2024-01-20 DIAGNOSIS — Z1231 Encounter for screening mammogram for malignant neoplasm of breast: Secondary | ICD-10-CM

## 2024-02-17 ENCOUNTER — Other Ambulatory Visit: Payer: Self-pay

## 2024-02-17 MED ORDER — TOPIRAMATE 100 MG PO TABS: 100.0000 mg | ORAL_TABLET | Freq: Two times a day (BID) | ORAL | 1 refills | Status: AC

## 2024-02-17 NOTE — Telephone Encounter (Signed)
 Faxed refill request for Topiramate .  Per chart patient not seen since 02/11/23 with no ROV scheduled.   Refill sent for 1 mo supply with 1 rf.  Please contact patient to schedule. Thank you!

## 2024-04-21 ENCOUNTER — Ambulatory Visit: Payer: Self-pay
